# Patient Record
Sex: Male | Born: 1965 | Race: Black or African American | Hispanic: No | Smoking: Current some day smoker
Health system: Southern US, Community
[De-identification: ages and names within clinical notes are randomized; demographics above are authoritative.]

## PROBLEM LIST (undated history)

## (undated) DIAGNOSIS — G479 Sleep disorder, unspecified: Secondary | ICD-10-CM

## (undated) DIAGNOSIS — J471 Bronchiectasis with (acute) exacerbation: Secondary | ICD-10-CM

## (undated) DIAGNOSIS — B20 Human immunodeficiency virus [HIV] disease: Secondary | ICD-10-CM

## (undated) DIAGNOSIS — F419 Anxiety disorder, unspecified: Secondary | ICD-10-CM

## (undated) DIAGNOSIS — G4733 Obstructive sleep apnea (adult) (pediatric): Secondary | ICD-10-CM

## (undated) DIAGNOSIS — R339 Retention of urine, unspecified: Secondary | ICD-10-CM

## (undated) DIAGNOSIS — N401 Enlarged prostate with lower urinary tract symptoms: Secondary | ICD-10-CM

## (undated) DIAGNOSIS — Z21 Asymptomatic human immunodeficiency virus [HIV] infection status: Secondary | ICD-10-CM

## (undated) DIAGNOSIS — F329 Major depressive disorder, single episode, unspecified: Secondary | ICD-10-CM

## (undated) HISTORY — PX: COLONOSCOPY: SHX174

---

## 2004-11-29 ENCOUNTER — Emergency Department: Payer: Self-pay | Admitting: Emergency Medicine

## 2005-11-01 ENCOUNTER — Emergency Department: Payer: Self-pay | Admitting: Emergency Medicine

## 2007-03-17 ENCOUNTER — Emergency Department: Payer: Self-pay | Admitting: Internal Medicine

## 2009-09-28 ENCOUNTER — Emergency Department: Payer: Self-pay | Admitting: Emergency Medicine

## 2011-12-30 ENCOUNTER — Emergency Department: Payer: Self-pay | Admitting: Unknown Physician Specialty

## 2011-12-30 LAB — CBC
HCT: 37 % — ABNORMAL LOW (ref 40.0–52.0)
MCH: 23.1 pg — ABNORMAL LOW (ref 26.0–34.0)
Platelet: 137 10*3/uL — ABNORMAL LOW (ref 150–440)
RBC: 5.13 10*6/uL (ref 4.40–5.90)
WBC: 4.1 10*3/uL (ref 3.8–10.6)

## 2011-12-30 LAB — URINALYSIS, COMPLETE
Bacteria: NONE SEEN
Leukocyte Esterase: NEGATIVE
Nitrite: NEGATIVE
Protein: NEGATIVE
RBC,UR: NONE SEEN /HPF (ref 0–5)
Specific Gravity: 1.014 (ref 1.003–1.030)
WBC UR: 1 /HPF (ref 0–5)

## 2011-12-30 LAB — DRUG SCREEN, URINE
Amphetamines, Ur Screen: NEGATIVE (ref ?–1000)
Benzodiazepine, Ur Scrn: NEGATIVE (ref ?–200)
Cannabinoid 50 Ng, Ur ~~LOC~~: NEGATIVE (ref ?–50)
Cocaine Metabolite,Ur ~~LOC~~: NEGATIVE (ref ?–300)
MDMA (Ecstasy)Ur Screen: NEGATIVE (ref ?–500)
Phencyclidine (PCP) Ur S: NEGATIVE (ref ?–25)
Tricyclic, Ur Screen: NEGATIVE (ref ?–1000)

## 2011-12-30 LAB — COMPREHENSIVE METABOLIC PANEL
Albumin: 3.9 g/dL (ref 3.4–5.0)
Alkaline Phosphatase: 41 U/L — ABNORMAL LOW (ref 50–136)
BUN: 13 mg/dL (ref 7–18)
Bilirubin,Total: 1 mg/dL (ref 0.2–1.0)
Calcium, Total: 7.9 mg/dL — ABNORMAL LOW (ref 8.5–10.1)
Co2: 22 mmol/L (ref 21–32)
EGFR (African American): >60
EGFR (Non-African Amer.): >60
Glucose: 101 mg/dL — ABNORMAL HIGH (ref 65–99)
Osmolality: 283 (ref 275–301)
SGOT(AST): 23 U/L (ref 15–37)

## 2011-12-30 LAB — CK TOTAL AND CKMB (NOT AT ARMC)
CK, Total: 208 U/L (ref 35–232)
CK-MB: 1.3 ng/mL (ref 0.5–3.6)

## 2011-12-30 LAB — ACETAMINOPHEN LEVEL: Acetaminophen: <2 ug/mL

## 2011-12-30 LAB — APTT: Activated PTT: 28.7 secs (ref 23.6–35.9)

## 2011-12-30 LAB — MAGNESIUM: Magnesium: 1.8 mg/dL

## 2011-12-30 LAB — ETHANOL
Ethanol %: 0.207 % — ABNORMAL HIGH (ref 0.000–0.080)
Ethanol: 207 mg/dL

## 2011-12-30 LAB — PROTIME-INR: Prothrombin Time: 15.4 secs — ABNORMAL HIGH (ref 11.5–14.7)

## 2012-07-27 ENCOUNTER — Inpatient Hospital Stay: Payer: Self-pay | Admitting: Psychiatry

## 2012-07-27 LAB — CBC WITH DIFFERENTIAL/PLATELET
Basophil %: 0.9 %
Eosinophil %: 1 %
HCT: 45.6 % (ref 40.0–52.0)
HGB: 14.6 g/dL (ref 13.0–18.0)
Lymphocyte #: 2.4 10*3/uL (ref 1.0–3.6)
MCH: 24 pg — ABNORMAL LOW (ref 26.0–34.0)
MCHC: 32 g/dL (ref 32.0–36.0)
MCV: 75 fL — ABNORMAL LOW (ref 80–100)
Monocyte #: 0.9 x10 3/mm (ref 0.2–1.0)
Monocyte %: 10.9 %
Neutrophil #: 4.5 10*3/uL (ref 1.4–6.5)
Neutrophil %: 56.8 %
Platelet: 169 10*3/uL (ref 150–440)
RBC: 6.09 10*6/uL — ABNORMAL HIGH (ref 4.40–5.90)
RDW: 17 % — ABNORMAL HIGH (ref 11.5–14.5)

## 2012-07-27 LAB — URINALYSIS, COMPLETE
Bacteria: NONE SEEN
Glucose,UR: NEGATIVE mg/dL (ref 0–75)
Ketone: NEGATIVE
Ph: 6 (ref 4.5–8.0)
Protein: NEGATIVE
RBC,UR: 1 /HPF (ref 0–5)
WBC UR: 2 /HPF (ref 0–5)

## 2012-07-27 LAB — BASIC METABOLIC PANEL
Calcium, Total: 8.7 mg/dL (ref 8.5–10.1)
Co2: 26 mmol/L (ref 21–32)
Creatinine: 1.12 mg/dL (ref 0.60–1.30)
EGFR (African American): >60
EGFR (Non-African Amer.): >60
Glucose: 128 mg/dL — ABNORMAL HIGH (ref 65–99)
Osmolality: 284 (ref 275–301)
Potassium: 3.9 mmol/L (ref 3.5–5.1)
Sodium: 141 mmol/L (ref 136–145)

## 2012-07-27 LAB — DRUG SCREEN, URINE
Cannabinoid 50 Ng, Ur ~~LOC~~: NEGATIVE (ref ?–50)
Cocaine Metabolite,Ur ~~LOC~~: POSITIVE (ref ?–300)
MDMA (Ecstasy)Ur Screen: NEGATIVE (ref ?–500)
Tricyclic, Ur Screen: NEGATIVE (ref ?–1000)

## 2012-07-27 LAB — ETHANOL: Ethanol: <3 mg/dL

## 2012-08-02 LAB — CULTURE, BLOOD (SINGLE)

## 2012-09-04 ENCOUNTER — Emergency Department: Payer: Self-pay | Admitting: *Deleted

## 2012-11-01 ENCOUNTER — Inpatient Hospital Stay: Payer: Self-pay | Admitting: Psychiatry

## 2012-11-01 LAB — URINALYSIS, COMPLETE
Bilirubin,UR: NEGATIVE
Ketone: NEGATIVE
Leukocyte Esterase: NEGATIVE
Ph: 5 (ref 4.5–8.0)
Protein: NEGATIVE
Specific Gravity: 1.033 (ref 1.003–1.030)
Squamous Epithelial: <1
WBC UR: 1 /HPF (ref 0–5)

## 2012-11-01 LAB — COMPREHENSIVE METABOLIC PANEL
Anion Gap: 6 — ABNORMAL LOW (ref 7–16)
BUN: 14 mg/dL (ref 7–18)
Bilirubin,Total: 0.8 mg/dL (ref 0.2–1.0)
Calcium, Total: 8.5 mg/dL (ref 8.5–10.1)
EGFR (African American): >60
EGFR (Non-African Amer.): >60
Glucose: 118 mg/dL — ABNORMAL HIGH (ref 65–99)
Osmolality: 285 (ref 275–301)
SGOT(AST): 33 U/L (ref 15–37)
SGPT (ALT): 29 U/L (ref 12–78)
Total Protein: 8 g/dL (ref 6.4–8.2)

## 2012-11-01 LAB — CBC
HGB: 13.4 g/dL (ref 13.0–18.0)
MCH: 23.7 pg — ABNORMAL LOW (ref 26.0–34.0)
MCHC: 32.9 g/dL (ref 32.0–36.0)
RDW: 14.5 % (ref 11.5–14.5)
WBC: 5.6 10*3/uL (ref 3.8–10.6)

## 2012-11-01 LAB — DRUG SCREEN, URINE
Amphetamines, Ur Screen: NEGATIVE (ref ?–1000)
Barbiturates, Ur Screen: NEGATIVE (ref ?–200)
Cocaine Metabolite,Ur ~~LOC~~: POSITIVE (ref ?–300)
MDMA (Ecstasy)Ur Screen: NEGATIVE (ref ?–500)
Tricyclic, Ur Screen: NEGATIVE (ref ?–1000)

## 2012-11-01 LAB — ETHANOL: Ethanol %: <0.003 % (ref 0.000–0.080)

## 2012-11-01 LAB — SALICYLATE LEVEL: Salicylates, Serum: <1.7 mg/dL

## 2012-11-01 LAB — ACETAMINOPHEN LEVEL: Acetaminophen: <2 ug/mL

## 2012-11-02 LAB — URINALYSIS, COMPLETE
Bacteria: NONE SEEN
Bilirubin,UR: NEGATIVE
Glucose,UR: NEGATIVE mg/dL (ref 0–75)
Nitrite: NEGATIVE
Ph: 5 (ref 4.5–8.0)
Specific Gravity: 1.029 (ref 1.003–1.030)
Squamous Epithelial: 1
WBC UR: 3 /HPF (ref 0–5)

## 2012-11-02 LAB — BEHAVIORAL MEDICINE 1 PANEL
Alkaline Phosphatase: 54 U/L (ref 50–136)
Anion Gap: 6 — ABNORMAL LOW (ref 7–16)
Basophil #: 0 10*3/uL (ref 0.0–0.1)
Calcium, Total: 8.3 mg/dL — ABNORMAL LOW (ref 8.5–10.1)
Co2: 26 mmol/L (ref 21–32)
EGFR (African American): >60
EGFR (Non-African Amer.): >60
Eosinophil #: 0.1 10*3/uL (ref 0.0–0.7)
Glucose: 97 mg/dL (ref 65–99)
HCT: 37.7 % — ABNORMAL LOW (ref 40.0–52.0)
HGB: 12.3 g/dL — ABNORMAL LOW (ref 13.0–18.0)
Lymphocyte #: 2.6 10*3/uL (ref 1.0–3.6)
MCHC: 32.7 g/dL (ref 32.0–36.0)
MCV: 71 fL — ABNORMAL LOW (ref 80–100)
Monocyte #: 0.5 x10 3/mm (ref 0.2–1.0)
Monocyte %: 11.6 %
Neutrophil #: 1.2 10*3/uL — ABNORMAL LOW (ref 1.4–6.5)
Osmolality: 284 (ref 275–301)
Platelet: 114 10*3/uL — ABNORMAL LOW (ref 150–440)
RBC: 5.3 10*6/uL (ref 4.40–5.90)
RDW: 14.6 % — ABNORMAL HIGH (ref 11.5–14.5)
SGOT(AST): 27 U/L (ref 15–37)
SGPT (ALT): 24 U/L (ref 12–78)
Sodium: 142 mmol/L (ref 136–145)
Thyroid Stimulating Horm: 0.875 u[IU]/mL
WBC: 4.4 10*3/uL (ref 3.8–10.6)

## 2013-01-08 ENCOUNTER — Emergency Department: Payer: Self-pay | Admitting: Emergency Medicine

## 2013-01-08 LAB — CBC
HCT: 43.1 % (ref 40.0–52.0)
MCV: 72 fL — ABNORMAL LOW (ref 80–100)
RBC: 5.97 10*6/uL — ABNORMAL HIGH (ref 4.40–5.90)
RDW: 15.8 % — ABNORMAL HIGH (ref 11.5–14.5)

## 2013-01-08 LAB — BASIC METABOLIC PANEL
Anion Gap: 7 (ref 7–16)
Chloride: 105 mmol/L (ref 98–107)
Co2: 27 mmol/L (ref 21–32)
Creatinine: 1.36 mg/dL — ABNORMAL HIGH (ref 0.60–1.30)
EGFR (African American): >60
EGFR (Non-African Amer.): >60
Osmolality: 278 (ref 275–301)
Potassium: 4.1 mmol/L (ref 3.5–5.1)
Sodium: 139 mmol/L (ref 136–145)

## 2013-01-09 LAB — URINALYSIS, COMPLETE
Bacteria: NONE SEEN
Blood: NEGATIVE
Glucose,UR: NEGATIVE mg/dL (ref 0–75)
Ketone: NEGATIVE
Leukocyte Esterase: NEGATIVE
Nitrite: NEGATIVE
Ph: 5 (ref 4.5–8.0)
Protein: 30

## 2013-01-09 LAB — DRUG SCREEN, URINE
Barbiturates, Ur Screen: NEGATIVE (ref ?–200)
Benzodiazepine, Ur Scrn: NEGATIVE (ref ?–200)
MDMA (Ecstasy)Ur Screen: NEGATIVE (ref ?–500)
Opiate, Ur Screen: NEGATIVE (ref ?–300)
Phencyclidine (PCP) Ur S: NEGATIVE (ref ?–25)
Tricyclic, Ur Screen: NEGATIVE (ref ?–1000)

## 2013-03-23 ENCOUNTER — Emergency Department: Payer: Self-pay | Admitting: Emergency Medicine

## 2013-05-18 IMAGING — CT CT HEAD WITHOUT CONTRAST
1 series · 16 of 30 positions shown, 20 images · non-contrast
Comparison: none

REASON FOR EXAM: ams
COMMENTS:

[Series 9: without · axial · non-contrast · 0.48mm/px · z∈[+109,+249]mm · 16 of 32 slices shown, 20 images]
[im 2/32  brain]
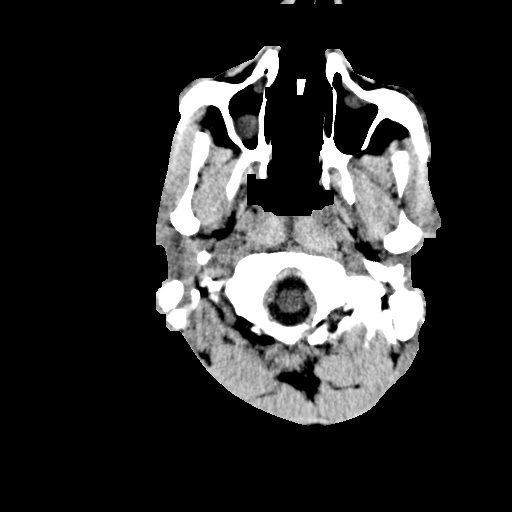
[im 2/32  bone]
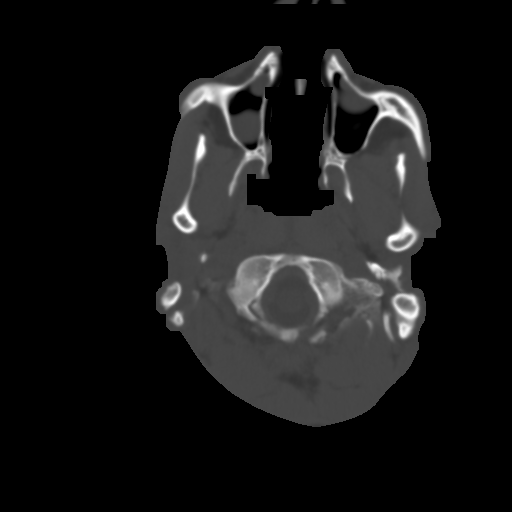
[im 4/32  brain]
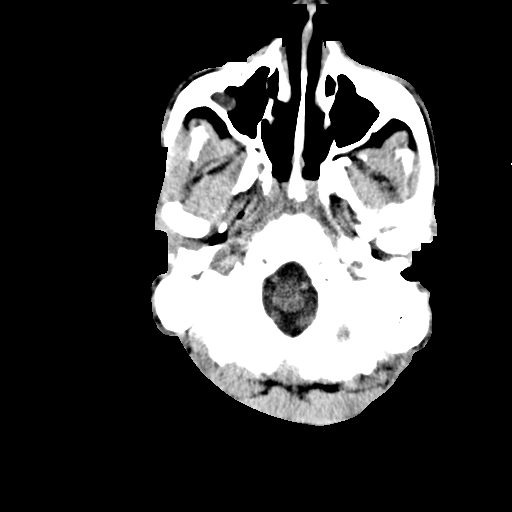
[im 6/32  brain]
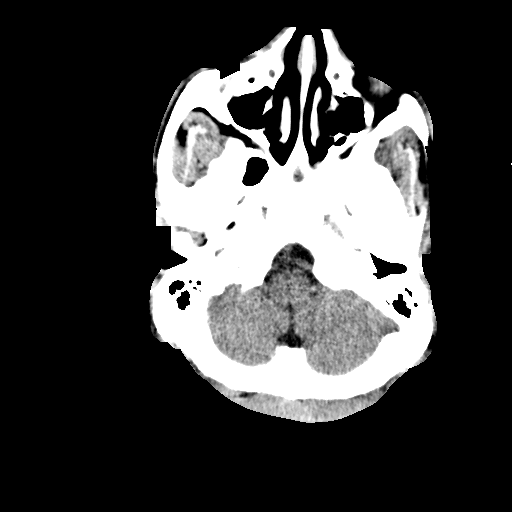
[im 8/32  brain]
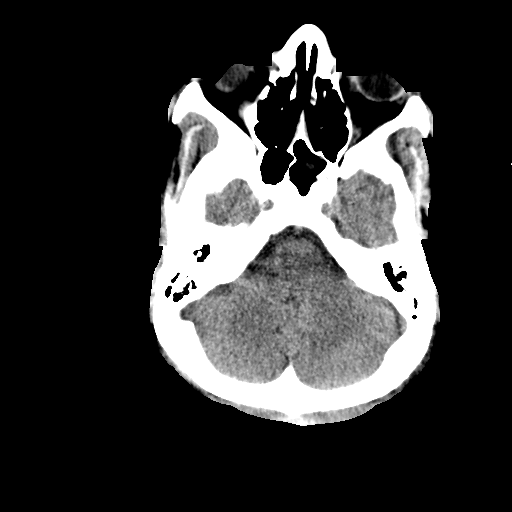
[im 9/32  brain]
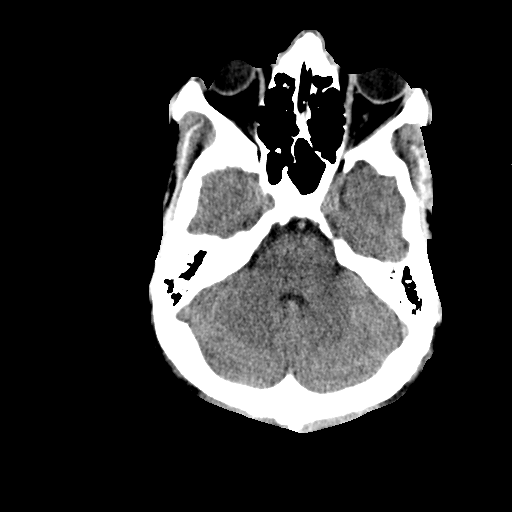
[im 9/32  bone]
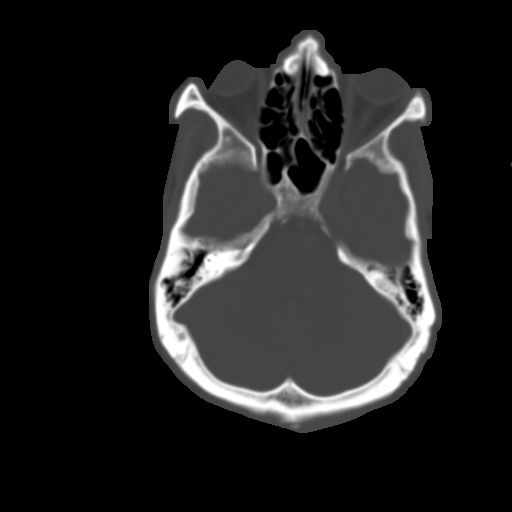
[im 11/32  brain]
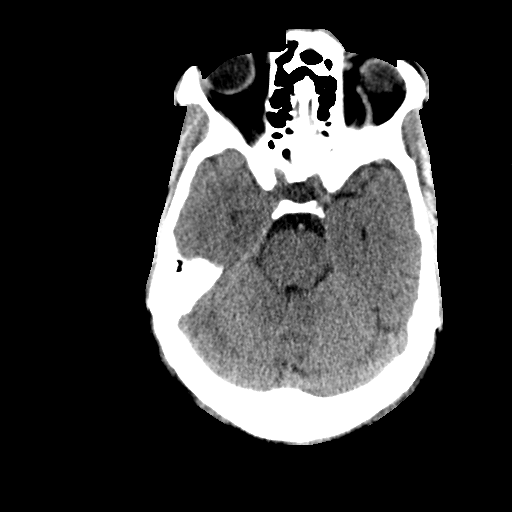
[im 13/32  brain]
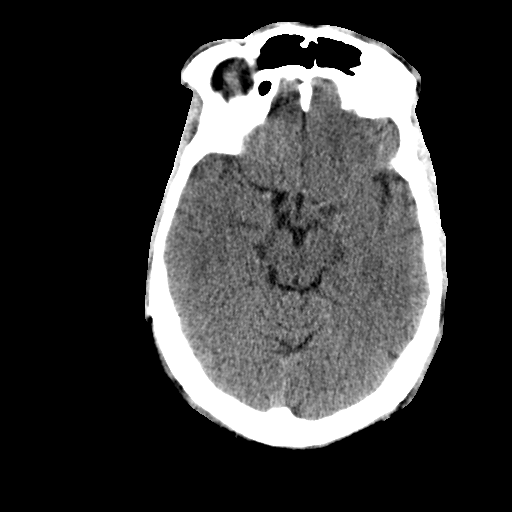
[im 15/32  brain]
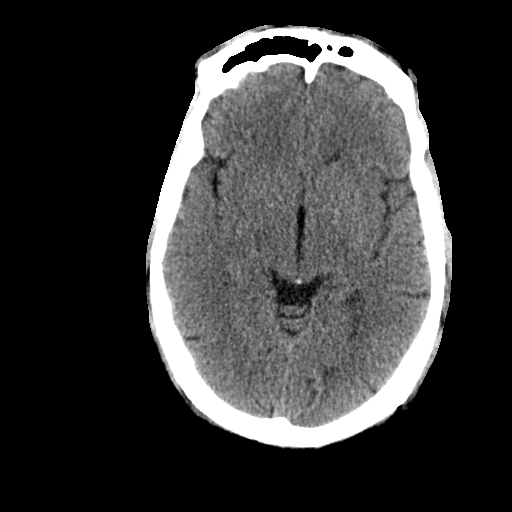
[im 17/32  brain]
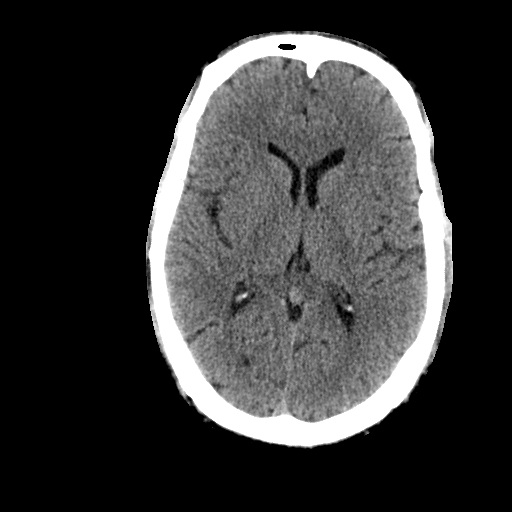
[im 17/32  bone]
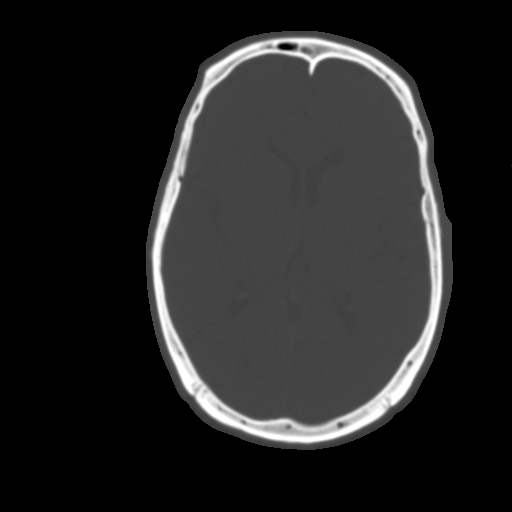
[im 19/32  brain]
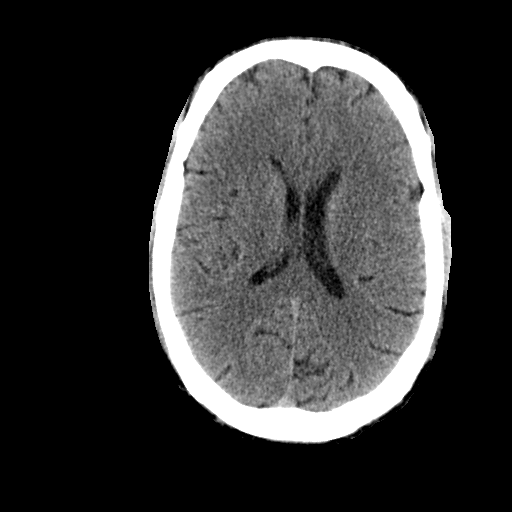
[im 21/32  brain]
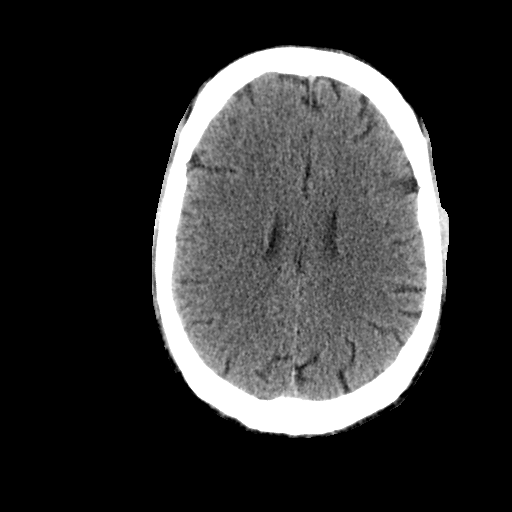
[im 23/32  brain]
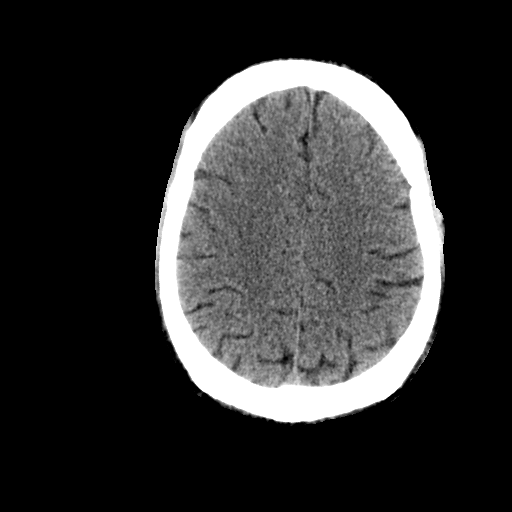
[im 24/32  brain]
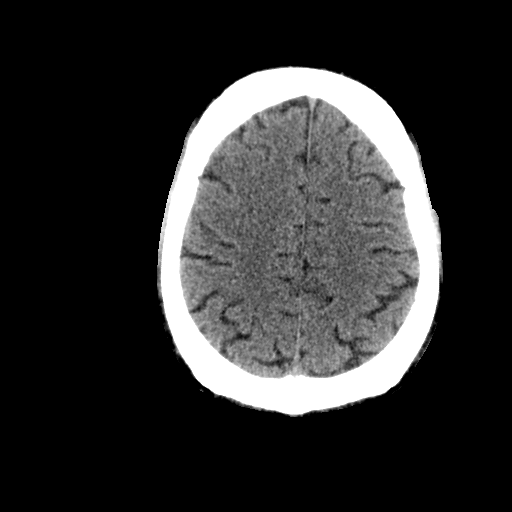
[im 24/32  bone]
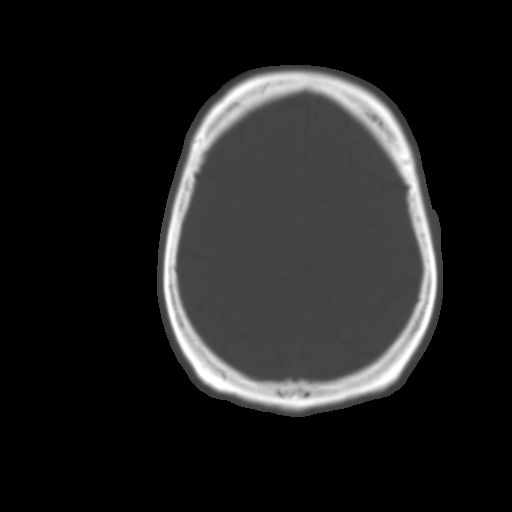
[im 26/32  brain]
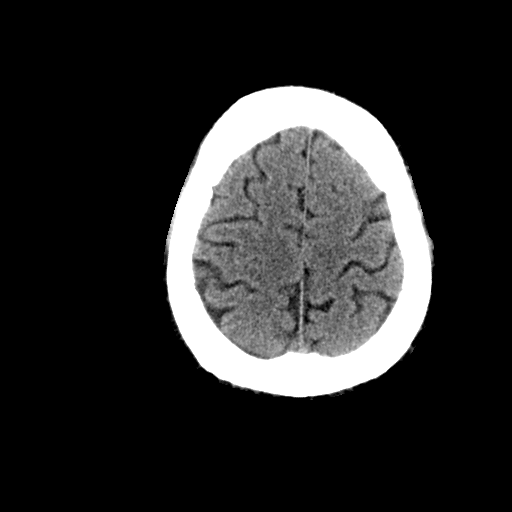
[im 28/32  brain]
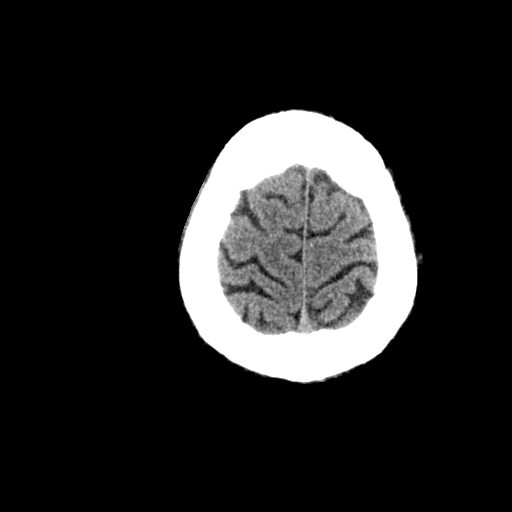
[im 30/32  brain]
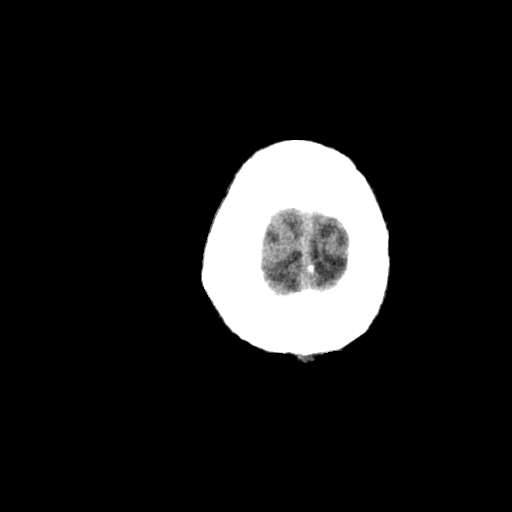

[16 of 30 positions shown; findings below may reference images not displayed]

PROCEDURE:     CT  - CT HEAD WITHOUT CONTRAST  - December 30, 2011  [DATE]

RESULT:     Noncontrast CT scanning was performed through the brain with
reconstructions in the axial plane at 5 mm intervals and slice thicknesses.

The ventricles are normal in size and position. There is no intracranial
hemorrhage nor intracranial mass effect. There is no evidence of an evolving
ischemic infarction. I see no abnormal intracranial calcifications. The
cerebellum and brainstem are normal in density.

At bone window settings there is soft tissue density material within the
maxillary sinuses which suggests retention cysts or polyps. A similar
finding is seen in a left sphenoid sinus cell. The ethmoid and frontal
sinuses are clear. There is no evidence of an acute skull fracture.
IMPRESSION: 1. I see no acute abnormality of the brain.
2. There are likely retention cysts or polyps in the maxillary and left
sphenoid sinus cells.

## 2013-07-05 LAB — CBC
HCT: 42.7 % (ref 40.0–52.0)
HGB: 14 g/dL (ref 13.0–18.0)
MCV: 72 fL — ABNORMAL LOW (ref 80–100)
RBC: 5.95 10*6/uL — ABNORMAL HIGH (ref 4.40–5.90)
RDW: 15.6 % — ABNORMAL HIGH (ref 11.5–14.5)
WBC: 5.4 10*3/uL (ref 3.8–10.6)

## 2013-07-05 LAB — DRUG SCREEN, URINE
Amphetamines, Ur Screen: NEGATIVE (ref ?–1000)
Cannabinoid 50 Ng, Ur ~~LOC~~: NEGATIVE (ref ?–50)
Cocaine Metabolite,Ur ~~LOC~~: POSITIVE (ref ?–300)
Opiate, Ur Screen: NEGATIVE (ref ?–300)
Phencyclidine (PCP) Ur S: NEGATIVE (ref ?–25)
Tricyclic, Ur Screen: NEGATIVE (ref ?–1000)

## 2013-07-05 LAB — COMPREHENSIVE METABOLIC PANEL
Albumin: 3.6 g/dL (ref 3.4–5.0)
Alkaline Phosphatase: 59 U/L (ref 50–136)
Anion Gap: 7 (ref 7–16)
Calcium, Total: 8.7 mg/dL (ref 8.5–10.1)
Chloride: 110 mmol/L — ABNORMAL HIGH (ref 98–107)
Creatinine: 1.1 mg/dL (ref 0.60–1.30)
Potassium: 4 mmol/L (ref 3.5–5.1)
SGOT(AST): 39 U/L — ABNORMAL HIGH (ref 15–37)
SGPT (ALT): 32 U/L (ref 12–78)
Sodium: 142 mmol/L (ref 136–145)
Total Protein: 8.1 g/dL (ref 6.4–8.2)

## 2013-07-05 LAB — URINALYSIS, COMPLETE
Bilirubin,UR: NEGATIVE
Blood: NEGATIVE
Nitrite: NEGATIVE
Protein: NEGATIVE
RBC,UR: 1 /HPF (ref 0–5)
WBC UR: 3 /HPF (ref 0–5)

## 2013-07-05 LAB — ETHANOL
Ethanol %: 0.024 % (ref 0.000–0.080)
Ethanol: 24 mg/dL

## 2013-07-07 ENCOUNTER — Inpatient Hospital Stay: Payer: Self-pay | Admitting: Psychiatry

## 2013-07-08 LAB — TSH: Thyroid Stimulating Horm: 0.655 u[IU]/mL

## 2013-12-14 IMAGING — CR RIGHT FOOT COMPLETE - 3+ VIEW
1 series · 3 of 3 positions shown · non-contrast
Comparison: none

REASON FOR EXAM: nail puncture wound yesterday, now with new pain and
swelling
COMMENTS:

PROCEDURE:     DXR - DXR FOOT RT COMPLETE W/OBLIQUES  - July 27, 2012  [DATE]
RESULT:     Three views of the right foot reveal the bones to be adequately
mineralized. There is no evidence of an acute fracture or dislocation. The
overlying soft tissues exhibit no foreign bodies or gas collections.

[Series 1: x foot ap right · 0.14mm/px · 3 of 3 slices shown]
[im 1/3]
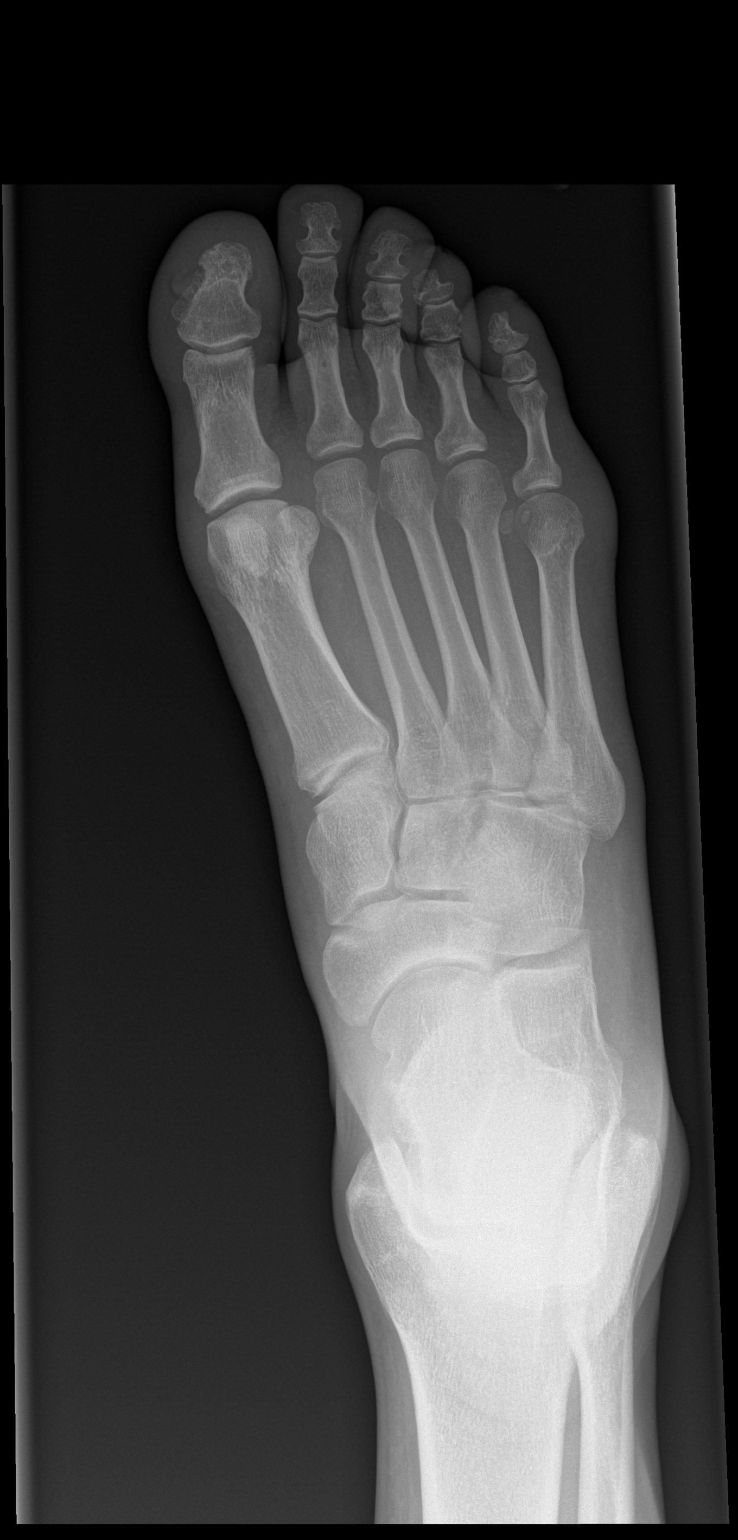
[im 2/3]
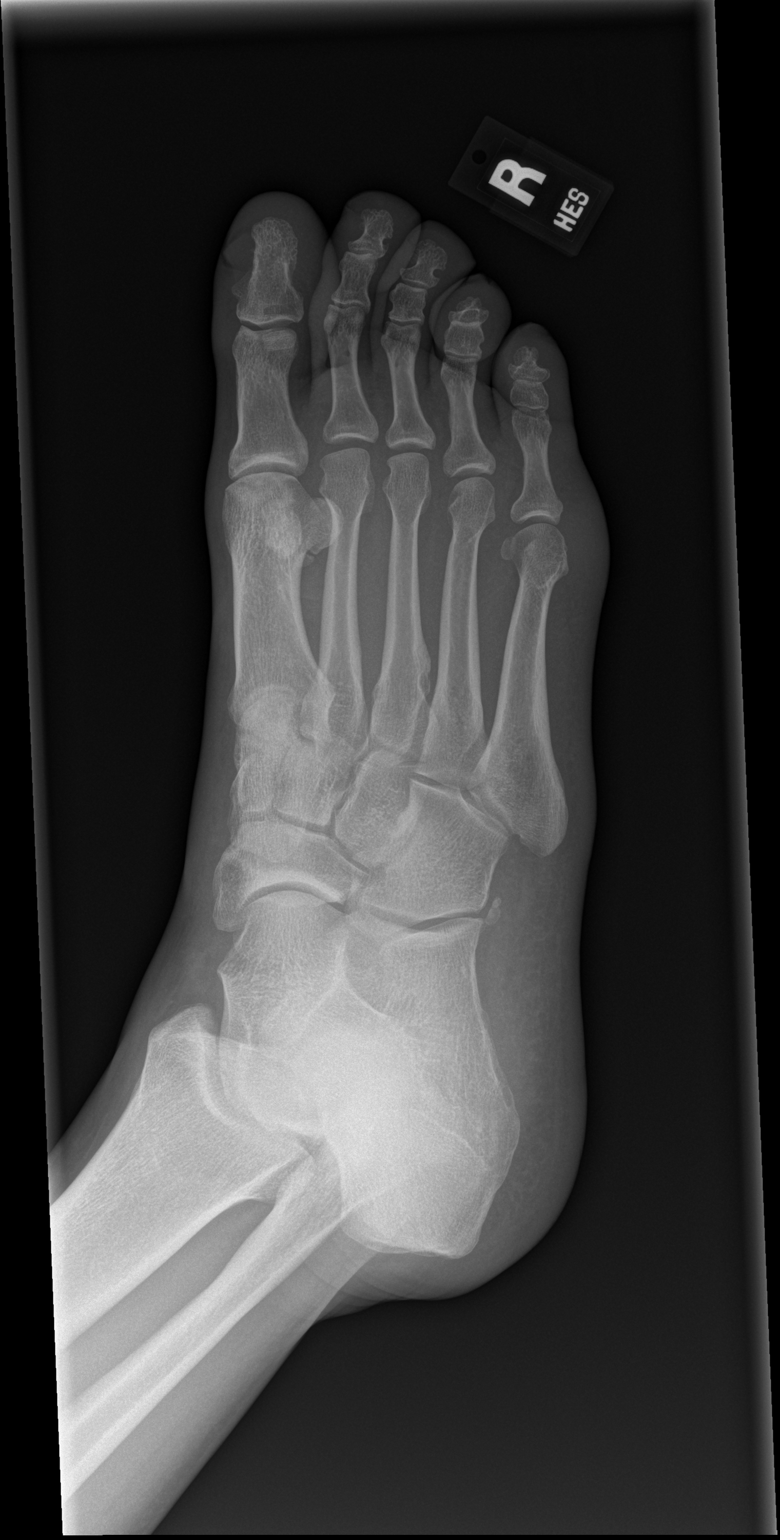
[im 3/3]
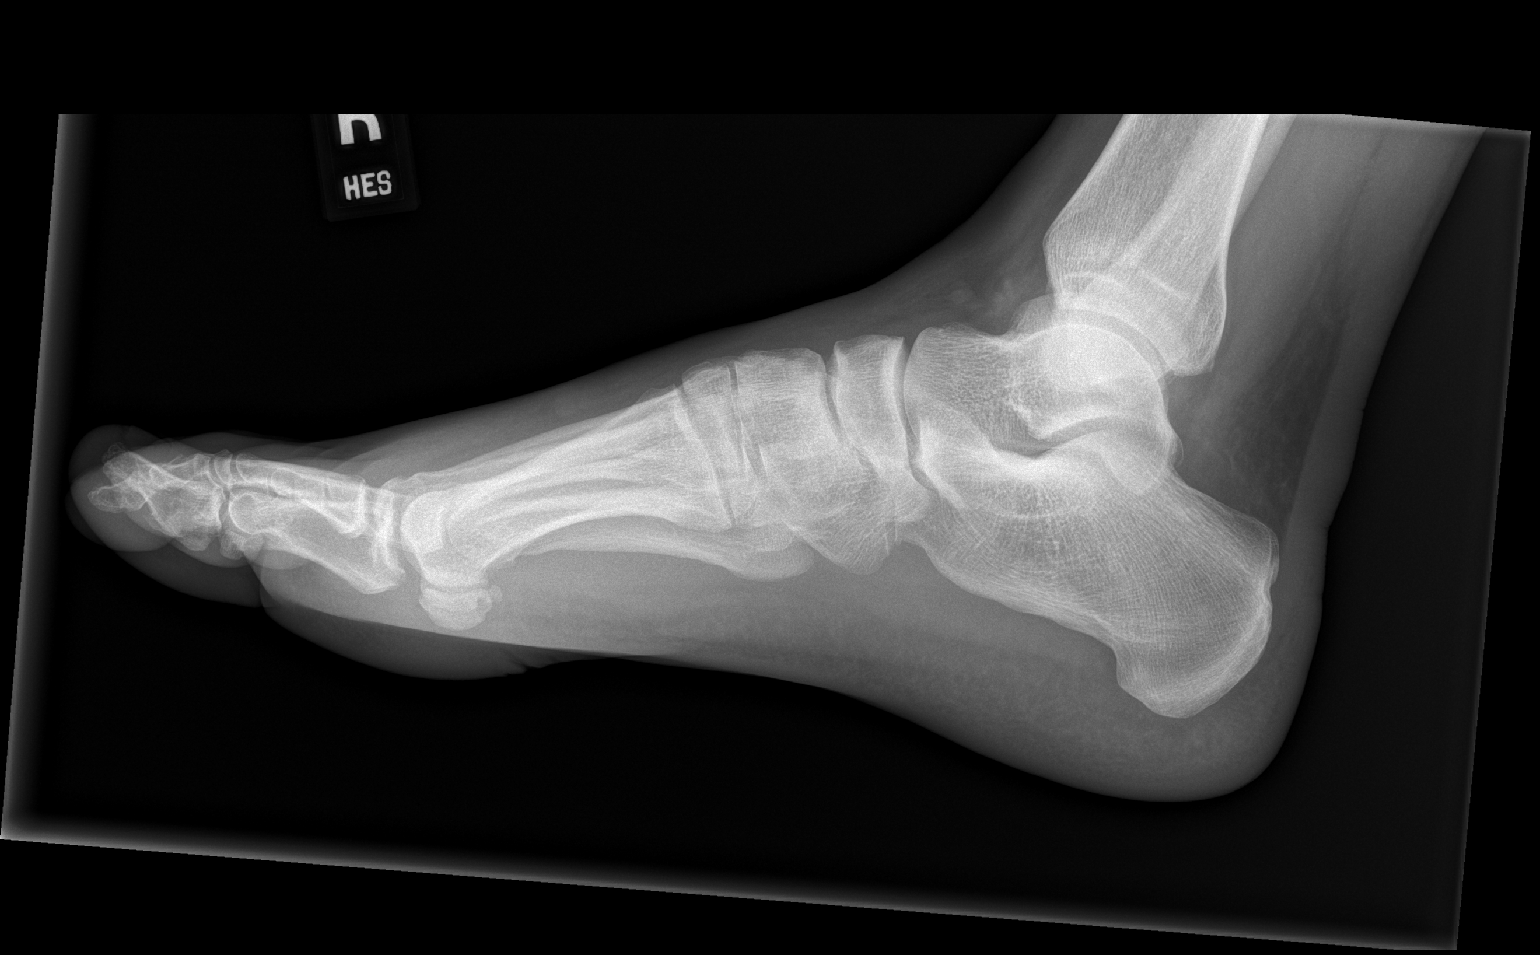

[3 of 3 positions shown; findings below may reference images not displayed]

IMPRESSION: There is no acute bony abnormality the right foot. No soft
tissue abnormality is demonstrated.

[REDACTED]

## 2015-03-09 NOTE — Discharge Summary (Signed)
PATIENT NAME:  Alan, Arnold MR#:  [id number] DATE OF BIRTH:  07-07-1966  DATE OF ADMISSION:  07/27/2012 DATE OF DISCHARGE:  08/02/2012  HOSPITAL COURSE: Patient is a 49 year old man with a history of alcohol and drug abuse who came to the Emergency Room voluntarily requesting help for detox. He had been drinking regularly and doing crack cocaine regularly. When he was using he was getting paranoid hearing voices at times and occasionally had vague suicidal ideation. He has had major social problems and losses related to his substance abuse. He was admitted to the hospital and treated with the standard protocol for alcohol withdrawal. He tolerated this well. Had some transient elevation of his blood pressure but otherwise was asymptomatic for the most part. He participated in groups appropriately. At no time was he aggressive or behaving in a dangerous manner. He was educated about substance abuse treatment and the options for further follow up. Patient is agreeable to going to the alcohol and drug abuse treatment center in [facility] for further follow-up treatment. He is a voluntary patient here and will be discharged in the morning with a taxi voucher for direct transport to [place]. He is agreeable to the plan and understands what is entailed. Patient has not been started on any psychiatric medicine other than p.r.n. trazodone for sleep. He is not currently endorsing symptoms of major depression or suicidal ideation or psychosis. Medically the patient has been treated for a cellulitis related to having stepped on a nail with his foot. He is on clindamycin currently at 300 mg 3 times a day. That was started on the 7th in the Emergency Room. Presume another five days of treatment unless further evaluation changes the plan. Patient also has a history of being HIV positive and has dropped out of receiving any follow up or treatment. An infectious disease consultation was requested while he was in the hospital but  time was not sufficient for the consult to be done before discharge. The patient has been advised that he should seek consultation with an infectious disease doctor for further recommendations at his earliest opportunity and states agreement to this.   DISCHARGE MEDICATIONS:  1. Cleocin 300 mg q.8 hours for another five days after discharge.  2. Trazodone 50 mg at night p.r.n. for sleep.   LABORATORY, DIAGNOSTIC AND RADIOLOGICAL DATA: Admission labs showed a drug screen positive for cocaine. The chemistry panel showed a glucose elevated at 128 but it was a nonfasting level. Chloride slightly elevated at 108. Alcohol undetectable at admission. CBC showed low MCV at 75, otherwise no marketable findings. Blood culture was done because of the foot injury and is negative. Urinalysis is unremarkable. An x-ray exam was done of the right foot which showed no bony abnormality or significant problem.   MENTAL STATUS EXAM: Casually dressed, neatly groomed man who looks his stated age. Polite and cooperative. Makes good eye contact. Psychomotor activity is normal. Speech is normal in rate, tone, and volume. Affect is a bit constricted but not depressed or bizarre. Mood is stated as being okay. Thoughts are lucid with no evidence of thought disorder or loosening of associations. Denies hallucinations. Denies any suicidal or homicidal ideation. He is alert and oriented x4 with good short and long term memory, normal intelligence apparent, reasonably good and improved insight and judgment.   DISPOSITION: Discharge with direct plan for transportation to [facility] in [place].   DIAGNOSIS PRINCIPLE AND PRIMARY:  AXIS I: Alcohol dependence.   SECONDARY DIAGNOSES:  AXIS I:  1. Cocaine dependence.  2. Substance-induced mood disorder, depressed.   AXIS II: Deferred.   AXIS III: HIV positive by history, acute cellulitis from puncture injury to the right foot.   AXIS IV: Severe from multiple losses from substance abuse.    AXIS V: Functioning at time of discharge 39.  ____________________________ Audery AmelJohn T. Ryllie Nieland, MD jtc:cms D: 08/01/2012 16:44:00 ET T: 08/01/2012 17:10:37 ET JOB#: [id number]  cc: Audery AmelJohn T. Javeah Loeza, MD, <Dictator> Audery AmelJOHN T Aadin Gaut MD ELECTRONICALLY SIGNED 08/01/2012 22:09

## 2015-03-09 NOTE — H&P (Signed)
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 The patient comes for readmission to [facility] after his recent discharge on 08/02/2012 where he was sent to [facility] in [place], [place]. His chief complaint, "I need help for detox, I've been drinking too much."    HISTORY OF PRESENT ILLNESS: The patient was discharged from [facility] on 08/02/2012 after detox and was given transportation for [facility] in [place]. He reports he stayed there for 7 days and wanted to go to outpatient program and so he walked out and then he did not have any transportation and currently he reports he has no place to stay and calls himself as homeless.   PAST PSYCHIATRIC HISTORY: This is his second inpatient hospitalization to psychiatry. No history of suicide attempts. First inpatient hospitalization to psychiatry was in September 2013 for a similar episode of drinking alcohol. The patient reports that he has been drinking alcohol "too much". He has been drinking six to eight 40 ounce beers per day and last drink was just prior to his admission.  He started drinking 9 days after his discharge, on 08/02/2012. No history of suicide attempts. Not being followed by any psychiatrist.   FAMILY HISTORY OF MENTAL ILLNESS: No known history of mental illness. No known history of suicides in the family.  FAMILY HISTORY: Raised by grandparents and his parents with alcoholics and drug abusers. Grandfather died of MI at age 75 years. Grandmother is living and is in her 21s. He used to live with his grandmother but not anymore.  PERSONAL HISTORY: Born in [place]. Dropped out in 11th grade. No GED.  He had a baby in 11th grade and had to take care of it.   WORK HISTORY: First job was at [employer] at age 27 years. This job lasted 2 years. Quit the job for another job. 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 Last worked several months and he was working for somebody and got paid under the table and had a regular job five years ago.   MILITARY HISTORY: None.  MARRIAGES: Married 3 times. Cause of divorce in all marriages was his drinking and drug abuse. Has 3 children and in touch with them and tries to be close, but because of his alcohol problems he cannot.   ALCOHOL AND DRUGS: First drink of alcohol was at 18 years. The patient reports it became a problem soon after, but he thinks it is not a problem and he can be helped. He has had 2 DWIs and lost his driver's license. He moves around by walking. Never arrested for public drunkenness. Currently has been drinking at the rate of six to eight 40 ounce beers per day and last drink was just prior to his admission. Does admit to using crack cocaine and smokes 30 dollars worth each time. Denies any THC, denies using IV drugs and smokes nicotine cigarettes as much as he can get for many years.   MEDICAL HISTORY: Has hypertension which is labile. No known history of diabetes mellitus. No major surgeries. No major injuries. No history of motor vehicle accident, never been unconscious. No known drug allergies. His primary  care physician is [place] and just called recently to make an appointment.   PHYSICAL EXAMINATION:  VITALS: temperature 97.4, pulse 60 per minute and regular, respirations 20 per minute and regular and blood pressure is 130/70 mmHg.  HEENT: Head is normocephalic, atraumatic. Eyes PERRLA. Fundi bilaterally benign. EOMs visualized. Tympanic membranes have no exudate.   NECK: Supple without any organomegaly, lymphadenopathy or thyromegaly.   CHEST: Normal expansion. Normal breath sounds.  HEART:  Normal S1 and S2 without any murmurs or gallops.  ABDOMEN: Soft. No organomegaly. Bowel sounds heard.   RECTAL: Deferred.  NEUROLOGIC: Gait - walks with a limp because of chronic pain secondary to injury secondary to football many years ago. Romberg is negative. Cranial nerves II through XII are grossly intact. DTRs are 2+. Plantars have normal response.  MENTAL STATUS EXAMINATION: The patient is dressed in street clothes, alert and oriented to place, person and time, calm, pleasant and cooperative, no agitation. Affect is appropriate with his mood which is low and down and depressed about his finances, living situation and being homeless. Does admit feeling worthless and useless at times but not now. Admits to feeling hopeless and useless but not now because he is being helped.  Denies any ideas or plans to hurt himself or others and contracts for safety. No psychosis. Denies auditory or visual hallucinations, denies hearing voices or seeing things, denies paranoid or suspicious ideas. Thought processes are logical, but goal direction needs to be questioned and his insight into alcohol drinking needs to be questioned. Memory is intact. He could spell the word world forward and backward without much problem. Judgment is fair and intact. He could count money. Abstract and interpretation is fair. Insight and judgment are guarded.   IMPRESSION:  AXIS I:  1. Alcohol dependence, chronic, continuous with intoxication. 2. Crack cocaine abuse, continuous. 3. Nicotine abuse. 4. Substance-induced mood disorder.  AXIS II: Deferred.  AXIS III: Chronic right foot pain secondary to status post football injury many years ago and HIV positive.  AXIS IV: Severe - occupational, financial, living situation and problems with primary support.  AXIS V: GAF 25.  PLAN: The patient is admitted to [facility] for closer observation, management and help. He will be started on alcohol detox and the rest of  the medications. During the stay in the hospital, he will be given milieu therapy, support and counseling. He will take part in individual and group therapy where substance problems will be addressed. At the time of discharge, detox will be completed and appropriate followup appointment will be made as desired by the patient.  ____________________________ Jannet MantisSurya K. Guss Bundehalla, MD skc:sb D: 11/02/2012 18:56:28 ET T: 11/03/2012 10:07:44 ET JOB#: [id number]  cc: Monika SalkSurya K. Guss Bundehalla, MD, <Dictator> Beau FannySURYA K Madden Garron MD ELECTRONICALLY SIGNED 11/03/2012 21:03

## 2015-03-09 NOTE — H&P (Signed)
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  The patient has been divorced three times and lives with his grandmother who is 58 years old.  The patient and grandmother live in a house.  The patient comes for his first inpatient hospitalization at [facility] with the chief complaint, "I need to get help for my drug abuse.  I am hearing voices.  These voices constantly talk to me and I cannot control them."  HISTORY OF PRESENT ILLNESS:  The patient reports that he has been smoking crack cocaine at the rate of one gram a day for the past 19 years.  He last smoked it just prior to admission to the hospital.  In addition, he reports that he has been drinking alcohol at the rate of six 40-ounce beers a day for the past three or four years.  He stays drunk most of the time.  He has been hearing voices for many years and these are bothering him and he decided to get help.  He called his cousin who brought him here for help.  PAST PSYCHIATRIC HISTORY:  No previous history of inpatient hospitalization on psychiatry, no history of suicide attempts.  Not being followed by any psychiatrist at this time.  FAMILY HISTORY OF MENTAL ILLNESS:  No known mental illness. No known history of suicides in the family.  FAMILY HISTORY:  Raised by grandparents and the parents were alcoholics, drunks, and drug abusers.  Grandfather died of myocardial infarction at the age of 4.  Grandmother is living, 4 years old and currently the patient lives with the grandmother.   PERSONAL HISTORY:  Born in [place], dropped out in the 11th grade because he had a baby.  No GED.  WORK HISTORY:   First job was at [employer] at the age 57 or 15 years.  This job lasted two years.  Quit for other job. 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  MILITARY HISTORY:  None.  MARRIAGES:  Married three times.  Cause of divorce in all marriages was his drinking and drug abuse.  He has three children and is in touch with them and trying to close but currently is not because of his drinking and staying drunk.  ALCOHOL AND DRUGS:  First drink of alcohol at 18 years.  When the patient was asked when it became a problem he reported that he never felt he had a problem with alcohol.  Two DWIs, lost his driver's license.  Never arrested for public drunkenness.  Drinking at the rate of six 40-ounce beers per day.  He does admit abusing crack cocaine by smoking for the past 19 years at the rate of one gram per day.  Denies THC.  Denies IV drugs.  Smokes 6-7 nicotine cigarettes per day.  MEDICAL HISTORY:  Has hypertension, which is labile.  No known history of diabetes mellitus.  No major surgeries.  No major injuries.  Has cellulitis and swelling and redness of the right foot with pain.  No history of motor  vehicle accidents.  Never been unconscious.  ALLERGIES:  No known drug allergies.  PRIMARY CARE PHYSICIAN:  Dr. Myrtie Hawk at [facility].  Last appointment several months ago.  Next appointment is to be made.  PHYSICAL EXAMINATION: VITAL SIGNS:  Temperature 97.8, pulse 72 per minute and regular.  Respirations 20 per minute and regular.  Blood pressure 130/90 mmHg.   HEENT:  Head is normocephalic, atraumatic.  Eyes- pupils equal, round, reactive to light and accommodation.  Fundi bilaterally benign.  EOMs visualized.  Tympanic membranes no exudate.  NECK:  Supple without any organomegaly, lymphadenopathy, or thyromegaly.   CHEST:  Normal expansion. Normal breath sounds.  HEART:   Normal S1, S2 without any murmurs or gallops.  ABDOMEN:  Soft.  No organomegaly.  Bowel sounds heard.  RECTAL:  Deferred.  NEUROLOGIC:  The patient walks with a limp because of pain in his right foot and swelling and redness of the same.  Romberg is negative.  Cranial nerves II-XII  grossly intact.  DTRs 2+ and normal. Plantars are normal response.  MENTAL STATUS EXAMINATION:  The patient is dressed in hospital clothes.  He was rather drowsy but knew where he was, though he said it was October, then said August, then finally he said September and did not know the exact date but he knew it was 2013.  He knew the capital of [place] and the capital of the [place] and name of the current president.  He does admit feeling depressed.  Affect is sad and mood depressed, admits feeling low and down, admits feeling hopeless and helpless.  Denies feeling worthless or useless.  Denies any ideas to hurt himself or others and wants to get help.  Does admit to auditory hallucinations, hearing voices, cannot identify these voices.  Sometimes these voices tell him to kill himself and he is not able to control the same.  He admits that he sees visual hallucinations and he sees bloody stuff in front of him, which he is not able to control.  Memory is intact.  He could spell the word "world" forward and backward without any problems.  Judgment is intact.  For a fire, he said he would leave.  For an envelope he said he would mail it.  He could count money.  Abstract interpretation is fair and adequate.  Insight and judgment guarded.  IMPRESSION: AXIS I.  Alcohol dependence, chronic, continuous, with intoxication.  Crack cocaine dependence, chronic continuous.  Nicotine abuse.  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  AXIS II.  Deferred.  AXIS III.  Right foot pain and redness.  Rule out cellulitis.  AXIS IV.  Severe.  Long history of alcohol dependence and crack  cocaine dependence.  Three divorces because of the same and is not able to function, not able to open up his business. Occupational and financial and social isolation.  AXIS V.  GAF 25.    PLAN:  The patient is admitted to [facility] for close observation and management and help.  He will be started on CIWA protocol and p.r.n. medications will be given.  During the stay in the hospital he will be given milieu therapy and supportive counseling.  As soon as he is ready he will take part in substance abuse counseling.  Hospitalist request will be made for his right foot pain and cellulitis and treatment of the same.  At the time of discharge, the patient will be stabilized and appropriate follow-up  appointments will be made in the community.     ____________________________ Jannet MantisSurya K. Guss Bundehalla, MD skc:bjt D: 07/28/2012 17:43:36 ET T: 07/29/2012 08:36:39 ET JOB#: [id number]  cc: Monika SalkSurya K. Guss Bundehalla, MD, <Dictator> Beau FannySURYA K Cleotilde Spadaccini MD ELECTRONICALLY SIGNED 08/04/2012 18:13

## 2015-03-12 NOTE — H&P (Signed)
PATIENT NAME:  Alan Arnold, Alan Arnold MR#:  [id number] DATE OF BIRTH:  05/08/1966  DATE OF ADMISSION:  07/07/2013  IDENTIFYING INFORMATION AND CHIEF COMPLAINT: This is a 49 year old man with a history of polysubstance dependence and being HIV positive, who presented to the hospital requesting detox.   CHIEF COMPLAINT: "I've been drinking too much."   HISTORY OF PRESENT ILLNESS: The patient states that recently he has been drinking more heavily. He has also been using cocaine on a regular basis. He says he does both of those about 3 or 4 times a week, but then goes on to clarify that he has been drinking up to 10, 40 ounce ounces bottles of beer a day. He feels like his mood has been worse and he complains of being very depressed. Feels sad a lot of the time. Energy, feels down. Hopelessness much of the time. Poor sleep and poor appetite. Says that he has lost some weight. It is not clear if he has a place to stay right now, he maybe between several different women's homes. He is not following up with outpatient treatment. He says he has had some suicidal thoughts, but has not acted on it.   PAST PSYCHIATRIC HISTORY: The patient has had several prior hospitalizations, most recently at our hospital in December of this year. In the past, he has been sent to the alcohol and drug abuse treatment center. He tells me that he very much does not want to do that now he feels that the alcohol and drug abuse treatment center was not helpful for him. He denies that he has made any suicide attempts. Denies history of psychotic symptoms. In the past diagnosis has been primarily has been substance dependence.   SOCIAL HISTORY: The patient is not currently working. He has applied for disability, but has not received it yet. He has been divorced in the past. He says that recently he was living with a girlfriend but then moved out and moved in with a different girlfriend. He complains that he used to be able to live with his  grandmother, but she threw him out of the house some time ago. He is bitter about this because he said that was his childhood home, and he feels like it was unfair to make him leave. He is not currently working and it sounds like his social support is not tremendous.   PAST MEDICAL HISTORY: The patient is HIV positive. He has been seen in the past at the [facility] at [facility] but is not following up there and is not on any medicine. In addition, he tells me that he has hepatitis, although it is not clear what kind of hepatitis he is referring to.   FAMILY HISTORY: Positive for substance abuse.   CURRENT MEDICATIONS: None.   ALLERGIES: No known drug allergies.   REVIEW OF SYSTEMS: Depressed mood, fatigue. Feels negative about himself. Feels angry in general. Having a little bit of withdrawal symptoms. Denies any acute suicidal intent, but has recently had suicidal ideation. No homicidal ideation. No psychotic symptoms.   MENTAL STATUS EXAMINATION: Casually dressed, reasonably well-groomed man who looks his stated age, cooperative with the interview. Good eye contact. Psychomotor activity slow and limited. Speech is normal in rate and tone. Affect flat. Mood stated as being depressed. Thoughts are generally lucid without loosening of associations or delusions. Denies auditory or visual hallucinations. Denies suicidal or homicidal intent. Judgment and insight recently impaired. Intelligence normal. Alert and oriented x 4.  PHYSICAL EXAMINATION: GENERAL: Healthy-appearing man who appears to be in no acute physical distress.  SKIN: No skin lesions identified.  HEENT: Pupils equal and reactive. Face symmetric. Strength and reflexes normal throughout. Gait normal. Full range of motion all extremities. Cranial nerves symmetric and normal.  LUNGS: Clear without wheezes.  HEART: Regular rate and rhythm.  ABDOMEN: Soft, nontender, normal bowel sounds.  VITAL SIGNS: Most recent vital  signs show temperature 98.5, pulse 64, respirations 18, blood pressure 141/86.   LABORATORY RESULTS: Admission labs done so far show a drug screen positive for cocaine, TSH low at 0.4. Alcohol was 24. Chemistry panel with slightly elevated chloride at 110. Hematology panel shows an elevated, red blood cell count at 5.95. Urinalysis: No sign of infection.   ASSESSMENT: A 49 year old man with alcohol and cocaine dependence and depression symptoms. Differential diagnosis includes substance-induced mood disorder as well as major depression. The patient does not have a history of delirium tremens. Does have physical problems that he is not taking care of. He has been admitted for detox and stabilization.   TREATMENT PLAN: Continue detox orders. Restart the trazodone that was used in the past for sleep. Defer any other psychiatric medicine for now. I am going to order infectious disease consult whether we need to do a further workup or treatment of his HIV status. Include him in groups and activities on the unit. He is absolutely against going to the alcohol and drug abuse treatment center so we will discuss other options for sobriety. Working already on doing some substance abuse treatment.   DIAGNOSIS, PRINCIPAL AND PRIMARY:   AXIS I: Polysubstance dependence, cocaine and alcohol prominent.   SECONDARY DIAGNOSES: AXIS I: Depression, not otherwise specified, rule out major depressive disorder versus substance-induced mood disorder.   AXIS II: Deferred.   AXIS III: HIV-positive, rule out hepatitis viral.   AXIS IV: Severe possible homelessness.   AXIS V: Functioning at time of evaluation 30.    ____________________________ Audery AmelJohn T. Clapacs, MD jtc:cc D: 07/07/2013 17:08:19 ET T: 07/07/2013 17:38:20 ET JOB#: [id number]  cc: Audery AmelJohn T. Clapacs, MD, <Dictator> Audery AmelJOHN T CLAPACS MD ELECTRONICALLY SIGNED 07/08/2013 11:13

## 2015-03-12 NOTE — Consult Note (Signed)
Chief Complaint:  Chief Complaint Requesting Detox from Alcohol   Presenting Symptoms:  Presenting Symptoms Anxiety/Panic  Substance Use   History of Present Illness:  History of Present Illness Pt is a 49 yo male with long h/o using alcohol, cocaine and is currently homeless presented to the ED. he stated that he was sick for the past couple of weeks and having sore throat, body aches, fever and unable to take care of himself. he has long h/o HIV and follows at [facility] for his HIV medciations. he stated that he has an upcoming appointment in May. reported that he was living with some friends and was consuming 2- 40 oz beers per day. Pt stated that he was also spending $30 on cocaine on a daily basis.  During my evaluation, he was sweating profusely and stated that he is feeling sick. Stated that he does not have a h/o withdrawl symtoms incl DT's and seizures. He is looking for some placement options as he might not be able to go back with his friends. He denied feelind depressed, anxious, or having any mood symptoms. No paranoia or agitation noted.  he denied SI/HI or plans at this time.   Target Symptoms:  Anxiety Somatic   Capacity Recognizes the presence of illlness  Understands consequences of treatment refusal  Exhibits acceptable reasoning/judgment  Communicates clear choices   Substance Abuse- Alcohol: The patient has a long history of alcohol dependence and despite multiple rehab efforts has not been able to remain sober for a sustained period of time..  Substance Abuse- Cocaine: The patient currently uses cocaine on a regular basis.Marland Kitchen  PAST MEDICAL & SURGICAL HX:  Significant Events:   Hepatitis C:    polysubstance abuse:    hiv:    denies surg hx:   CURRENT OUTPATIENT MEDICATIONS:  Home Medications: Medication Instructions Status  homatropine-HYDROcodone 1.5 mg-5 mg/5 mL oral syrup 5 milliliter(s) orally every 4 hours as needed for cough Active  SMZ-TMP DS  800 mg-160 mg oral tablet 1 tab(s) orally 2 times a day Active   Mental Status Exam:  Mental Status Exam Moderately built male lying in bed under the covers. he was shivering and sweating profusely at time of interview. Asked the ED nurse to check his vitals and they came back normal.   Speech Soft   Mood Anxious   Affect congruent   Thought Processes Linear/logical/goal directed   Orientation Place  Time  Person   Attention Awake   Concentration [place]   Insight Fair   Suicide Risk Assessment: Suicide Risk Level No risk inidicated.  Review of Systems:  Review of Systems:  Fever/Chills No   Cough Yes   Medications/Allergies Reviewed Medications/Allergies reviewed   NURSING FLOWSHEETS:  Vital Signs/Nurse Notes-CM: ED Vital Sign Flow Sheet:   20-Feb-14 10:27  Temp Temperature 98.3  Temp Source oral  Pulse Pulse 78  Respirations Respirations 18  SBP SBP 134  DBP DBP 80  Pulse Ox % Pulse Ox % 97  Pulse Ox Source Source Room Air  Pain Scale (0-10) Pain Scale (0-10) Scale:6  ED Behavioral Health Assessment:   20-Feb-14 08:00  Cognitive Assessment Patient Sleeping  Behavior Patient Sleeping  Nutrition and fluids offered? Sleeping  Sitter Present? Sitter Present? No  Engineer, structural Present? Yes  Conservation officer, nature? Forenisc equipment is not being utilized.  Have you  checked for scheduled medications? Yes  Potentially Harmful Objects Removed From Room Yes  Personal Belongings Secured Yes  Patient Wearing Only Hospital Provided Gown or Scrubs Yes  Plastic Bags Are Removed From Room Yes  BP Cuff, Cords, O2 and Suction Equipment Either Removed or Counted Removed  All Monitoring Cords, Cables, and Pt Care Equipment Removed or Counted Removed  Equipment and Supplies Removed From Under Stretcher Yes  All Potentially Toxic Materials Removed From Room  Yes  Nurse Call Bell or TV Controller wih Cords Removed, Shortened or Counted Removed  Sharps Container is Removed Yes  Potentially Harmful Objects Removed From Hall  Yes   LAB:  Laboratory Results: Routine Chem:    19-Feb-14 90:30, Basic Metabolic Panel (w/Total Calcium)  Glucose, Serum 118  BUN 12  Creatinine (comp) 1.36  Sodium, Serum 139  Potassium, Serum 4.1  Chloride, Serum 105  CO2, Serum 27  Calcium (Total), Serum 8.4  Anion Gap 7  Osmolality (calc) 278  eGFR (African American) >60  eGFR (Non-African American) >60  eGFR values <30m/min/1.73 m2 may be an indication of chronic  kidney disease (CKD).  Calculated eGFR is useful in patients with stable renal function.  The eGFR calculation will not be reliable in acutely ill patients  when serum creatinine is changing rapidly. It is not useful in   patients on dialysis. The eGFR calculation may not be applicable  to patients at the low and high extremes of body sizes, pregnant  women, and vegetarians.  Result Comment   potassium - Slight hemolysis, interpret results with   - caution.   Result(s) reported on 08 Jan 2013 at 06:40PM.    19-Feb-14 18:07, Ethanol, Serum  Ethanol, S. < 3  Ethanol % (comp) < 0.003  Result Comment   ETHANOL - VERIFIED IN ERROR. UNOPENED TUBE IS   - NOT AVAILABLE. LAB WILL REORDER. EMR   - NOTIFIED FOR RECOLLECT...TPL    20-Feb-14 02:48, Ethanol, Serum  Ethanol, S. < 3  Ethanol % (comp) < 0.003  Result(s) reported on 09 Jan 2013 at 06:40AM.  Urine Drugs:    20-Feb-14 02:07, Urine Drug Screen, Qual  Tricyclic Antidepressant, Ur Qual (comp) NEGATIVE  Result(s) reported on 09 Jan 2013 at 02:26AM.  Amphetamines, Urine Qual. NEGATIVE  MDMA, Urine Qual. NEGATIVE  Cocaine Metabolite, Urine Qual. POSITIVE  Opiate, Urine qual NEGATIVE  Phencyclidine, Urine Qual. NEGATIVE  Cannabinoid, Urine Qual. NEGATIVE  Barbiturates, Urine Qual. NEGATIVE  Benzodiazepine, Urine Qual. NEGATIVE   -----------------  The URINE DRUG SCREEN provides only a preliminary, unconfirmed  analytical test result and should not be used for non-medical   purposes.  Clinical consideration and professional judgment should be   applied to any positive drug screen result due to possible  interfering substances.  A more specific alternate chemical method  must be used in order to obtain a confirmed analytical result.  Gas  chromatography/mass spectrometry (GC/MS) is the preferred  confirmatory method.  Methadone, Urine Qual. NEGATIVE  Routine UA:    20-Feb-14 02:07, Urinalysis  Color (UA) Yellow  Clarity (UA) Hazy  Glucose (UA) Negative  Bilirubin (UA) Negative  Ketones (UA) Negative  Specific Gravity (UA) 1.031  Blood (UA) Negative  pH (UA) 5.0  Protein (UA) 30 mg/dL  Nitrite (UA) Negative  Leukocyte Esterase (UA) Negative  Result(s) reported on 09 Jan 2013 at 02:31AM.  RBC (UA) 1 /HPF  WBC (UA) 1 /HPF  Bacteria (UA)   NONE SEEN  Epithelial Cells (UA) <1 /HPF  Mucous (UA) PRESENT  Result(s) reported on 09 Jan 2013 at 02:31AM.  Routine Hem:    19-Feb-14 18:07, Hemogram, Platelet Count  WBC (CBC) 7.2  RBC (CBC) 5.97  Hemoglobin (CBC) 13.7  Hematocrit (CBC) 43.1  Platelet Count (CBC) 212  Result(s) reported on 08 Jan 2013 at 06:48PM.  MCV 72  MCH 22.9  MCHC 31.7  RDW 15.8   Assessment & Diagnosis: Axis I: Polysubstance Dependence.   Axis III: HIV, Acute Bronchitis.   Axis IV: Problems with primary support group  Problems related to social environment  Problems accessing health care .  Treatment Plan: Case discussed with Dr Arlan Organ and he agreed to re-evalute the pt for medical clearence.  Mr Jimmye Norman will refer the pt to [facility] for placement once stable.  No meds will be prescribed as he does not appear to have any acute w/d symptoms at this time.  Pt will follow up with [facility] clinic for HIV.  He demonstarted understanding.  Electronic Signatures: Jeronimo Norma  (MD)  (Signed 20-Feb-14 14:06)  Authored: Chief Complaint, Presenting Symptoms, History of Present Illness, Target Symptoms, Substance Abuse History, PAST MEDICAL & SURGICAL HX, CURRENT OUTPATIENT MEDICATIONS, Mental Status Exam, Suicide Risk Assessment, Review of Systems, NURSING FLOWSHEETS, LAB, Assessment & Diagnosis, Treatment Plan   Last Updated: 20-Feb-14 14:06 by Jeronimo Norma (MD)

## 2015-03-12 NOTE — Discharge Summary (Signed)
PATIENT NAME:  Alan Arnold, Alan Arnold MR#:  [id number] DATE OF BIRTH:  10-14-1966  DATE OF ADMISSION:  07/07/2013 DATE OF DISCHARGE:  07/15/2013  HOSPITAL COURSE: See dictated history and physical for details of admission. This 49 year old man with a history of polysubstance dependence and mood problems presented to the hospital requesting detox. He had been relapsed into recent heavy drinking. It had affected his mood such that he was fighting with his girlfriend. She had thrown him out. He had also been using cocaine. He was starting to feel depressed. He endorsed some passive suicidal ideation without specific intent. In the hospital, the patient was treated with the usual detox protocol for alcohol. He detoxed without difficulty. He became more engaged gradually in groups on the unit. His mood was treated with fluoxetine 40 mg a day, risperidone 1 mg a day and trazodone 100 mg at night, which apparently had been helpful for stabilizing his mood in the past. He tolerated all this without difficulty. His affect and mood gradually improved, and he became more outgoing and relaxed. He denied any suicidal ideation later in his hospital stay. The patient was interested in trying to find some kind of rehab program, but when it was discovered that he currently has legal problems, being in violation of his probation, it appeared that he was not going to be eligible for any other kind of services. It was recommended to him that he follow up in the community with [facility]. He was agreeable to that. His plan was to stay either with another girlfriend in the area or a friend, and he appeared to be certain he could find safe living situations. Consultation was obtained with infectious disease while he was in the hospital because of his HIV positive status. The upshot of that was that he would [facility] and did not need to restart any HIV medicine at this point because his lab values were in the safe enough range.   MENTAL  STATUS EXAMINATION AT DISCHARGE: Neatly dressed and groomed man, looks his stated age. Cooperative with the interview. Good eye contact. Normal psychomotor activity. Speech is normal in rate, tone and volume. Affect is euthymic, reactive, appropriate. Mood is stated as good. Thoughts appear to be lucid, without any loosening of associations or delusions. Denies auditory or visual hallucinations. Denies suicidal or homicidal ideation. Shows improved judgment and insight. Normal intelligence. Alert and oriented x4.   LABORATORY DATA: His absolute T4 helper cell count was 585, which is within the normal range. TSH normal at 0.65. Admission drug screen positive for cocaine. Urinalysis unremarkable. Alcohol low at 24. Chemistry showed no significant abnormalities. CBC: No significant abnormalities.   DISCHARGE MEDICATIONS: Fluoxetine 40 mg p.o. daily, Risperdal 1 mg p.o. at bedtime, trazodone 100 mg p.o. at bedtime.   DIAGNOSIS, PRINCIPAL AND PRIMARY:  AXIS I: Alcohol dependence.   SECONDARY DIAGNOSIS:  AXIS I:  1. Cocaine dependence.  2. Substance-induced mood disorder, depressed.  AXIS II: Borderline and antisocial features.  AXIS III: Human immunodeficiency virus positive.  AXIS IV: Severe from temporary homelessness, lack of social support.  AXIS V: Functioning at time of discharge 55.   ____________________________ Audery AmelJohn Arnold. Khalfani Weideman, MD jtc:OSi D: 07/28/2013 22:48:11 ET Arnold: 07/29/2013 07:01:09 ET JOB#: [id number]  cc: Audery AmelJohn Arnold. Nancylee Gaines, MD, <Dictator> Audery AmelJOHN Arnold Jansen Goodpasture MD ELECTRONICALLY SIGNED 07/29/2013 9:52

## 2015-03-12 NOTE — Consult Note (Signed)
PATIENT NAME:  Alan Arnold, Alan Arnold MR#:  [id number] DATE OF BIRTH:  Sep 04, 1966  DATE OF CONSULTATION:  07/09/2013  CONSULTING PHYSICIAN:  Stann Mainlandavid P. Sampson GoonFitzgerald, MD  REQUESTING PHYSICIAN: Dr. Toni Amendlapacs in psychiatry.   REASON FOR CONSULTATION: HIV infection.   HISTORY OF PRESENT ILLNESS: This is a pleasant 49 year old gentleman with a history of depression, polysubstance abuse and HIV, who was admitted 08/18 for detox from alcohol and cocaine. He has been admitted and is doing relatively well with detox. Per the patient, he has had HIV since 2004 and has [facility] with Dr. Glendell DockerByrd Quinlivan since that time. She has not suggested that he go on ARVs at this point but he has not seen them in over a year. He currently has no fevers, night sweats, cough, shortness of breath, thrush, oral sores, difficulty swallowing. He does have mild occasional loose stools but no abdominal pain, nausea or vomiting. He has no dysuria.   PAST MEDICAL HISTORY: 1.  HIV as above.  2.  Depression.  3.  Substance abuse.  4.  Hepatitis, although he is not sure if it is B or C.   SOCIAL HISTORY: The patient is unemployed. He has applied for disability. He is divorced. His wife has HIV but is on medications and [facility]. He has had several girlfriends recently. He uses alcohol and cocaine as above.   FAMILY HISTORY: Noncontributory.   CURRENT MEDICATIONS: None.   ALLERGIES: No known drug allergies.   REVIEW OF SYSTEMS:  11 systems reviewed,  negative except as per history of present illness.   PHYSICAL EXAMINATION: VITAL SIGNS: Afebrile, temperature of 98, pulse 55, blood pressure 121/81.  GENERAL: He is pleasant, interactive, in no acute distress, walking well, well-dressed.  HEENT: Pupils equal, round and reactive to light and accommodation. Extraocular movements are intact. Sclerae anicteric. Oropharynx: Clear.  NECK: Supple.  HEART: Regular.  LUNGS: Clear to auscultation bilaterally.  ABDOMEN: Soft,  nontender, nondistended. No hepatosplenomegaly.  EXTREMITIES: No clubbing, cyanosis or edema.  SKIN: He has multiple tattoos.  NEUROLOGIC:  He is alert and oriented x3.  Grossly nonfocal neuro exam.   LABORATORY DATA: White blood count on admission 5.4, hemoglobin 14.0, platelets 173. Basic metabolic panel and liver tests are normal except for slightly elevated AST of 39. TSH is slightly low at 0.429. Urinalysis is negative.   IMPRESSION: A 49 year old with human immunodeficiency virus, unknown CD4 count, [facility], admitted with depression and detox.   RECOMMENDATIONS: 1.  Check a CD4 today.  2.  Check an RPR.  3.  I discussed with the patient that it would be best if [facility]. He is already connected with [facility] the local HIV resource center. They can help provide transportation.  4.  We will see him again once the CD4 is back to see if there are any other interventions  needed. We will start no medications at this time.   Thank you for the consult. I will be glad to follow with you.    ____________________________ Stann Mainlandavid P. Sampson GoonFitzgerald, MD dpf:dp D: 07/09/2013 15:59:26 ET Arnold: 07/09/2013 16:35:26 ET JOB#: [id number]  cc: Stann Mainlandavid P. Sampson GoonFitzgerald, MD, <Dictator> Navy Roths[place] Sampson GoonFITZGERALD MD ELECTRONICALLY SIGNED 07/11/2013 19:22

## 2015-10-28 ENCOUNTER — Emergency Department
Admission: EM | Admit: 2015-10-28 | Discharge: 2015-10-28 | Disposition: A | Discharge status: Home / Self Care | Payer: Self-pay | Attending: Emergency Medicine | Admitting: Emergency Medicine

## 2015-10-28 ENCOUNTER — Encounter: Payer: Self-pay | Admitting: *Deleted

## 2015-10-28 DIAGNOSIS — Y998 Other external cause status: Secondary | ICD-10-CM | POA: Insufficient documentation

## 2015-10-28 DIAGNOSIS — Y9289 Other specified places as the place of occurrence of the external cause: Secondary | ICD-10-CM | POA: Insufficient documentation

## 2015-10-28 DIAGNOSIS — K029 Dental caries, unspecified: Secondary | ICD-10-CM | POA: Insufficient documentation

## 2015-10-28 DIAGNOSIS — K047 Periapical abscess without sinus: Secondary | ICD-10-CM | POA: Insufficient documentation

## 2015-10-28 DIAGNOSIS — F17200 Nicotine dependence, unspecified, uncomplicated: Secondary | ICD-10-CM | POA: Insufficient documentation

## 2015-10-28 DIAGNOSIS — Y9389 Activity, other specified: Secondary | ICD-10-CM | POA: Insufficient documentation

## 2015-10-28 DIAGNOSIS — J069 Acute upper respiratory infection, unspecified: Secondary | ICD-10-CM | POA: Insufficient documentation

## 2015-10-28 DIAGNOSIS — S39012A Strain of muscle, fascia and tendon of lower back, initial encounter: Secondary | ICD-10-CM | POA: Insufficient documentation

## 2015-10-28 DIAGNOSIS — R197 Diarrhea, unspecified: Secondary | ICD-10-CM | POA: Insufficient documentation

## 2015-10-28 DIAGNOSIS — K0381 Cracked tooth: Secondary | ICD-10-CM | POA: Insufficient documentation

## 2015-10-28 DIAGNOSIS — X58XXXA Exposure to other specified factors, initial encounter: Secondary | ICD-10-CM | POA: Insufficient documentation

## 2015-10-28 DIAGNOSIS — B20 Human immunodeficiency virus [HIV] disease: Secondary | ICD-10-CM | POA: Insufficient documentation

## 2015-10-28 HISTORY — DX: Human immunodeficiency virus (HIV) disease: B20

## 2015-10-28 HISTORY — DX: Bronchiectasis with (acute) exacerbation: J47.1

## 2015-10-28 HISTORY — DX: Asymptomatic human immunodeficiency virus (hiv) infection status: Z21

## 2015-10-28 MED ORDER — AMOXICILLIN 500 MG PO TABS: 500.0000 mg | ORAL_TABLET | Freq: Three times a day (TID) | ORAL | Status: DC

## 2015-10-28 MED ORDER — CYCLOBENZAPRINE HCL 10 MG PO TABS: 10.0000 mg | ORAL_TABLET | Freq: Three times a day (TID) | ORAL | Status: DC | PRN

## 2015-10-28 MED ORDER — OXYCODONE-ACETAMINOPHEN 5-325 MG PO TABS: 1.0000 | ORAL_TABLET | Freq: Four times a day (QID) | ORAL | Status: DC | PRN

## 2015-10-28 MED ORDER — AMOXICILLIN 500 MG PO CAPS
500.0000 mg | ORAL_CAPSULE | Freq: Once | ORAL | Status: AC
  Administered 2015-10-28: 500 mg via ORAL
  Filled 2015-10-28: qty 1

## 2015-10-28 NOTE — Discharge Instructions (Signed)
Dental Abscess A dental abscess is a collection of pus in or around a tooth. CAUSES This condition is caused by a bacterial infection around the root of the tooth that involves the inner part of the tooth (pulp). It may result from:  Severe tooth decay.  Trauma to the tooth that allows bacteria to enter into the pulp, such as a broken or chipped tooth.  Severe gum disease around a tooth. SYMPTOMS Symptoms of this condition include:  Severe pain in and around the infected tooth.  Swelling and redness around the infected tooth, in the mouth, or in the face.  Tenderness.  Pus drainage.  Bad breath.  Bitter taste in the mouth.  Difficulty swallowing.  Difficulty opening the mouth.  Nausea.  Vomiting.  Chills.  Swollen neck glands.  Fever. DIAGNOSIS This condition is diagnosed with examination of the infected tooth. During the exam, your dentist may tap on the infected tooth. Your dentist will also ask about your medical and dental history and may order X-rays. TREATMENT This condition is treated by eliminating the infection. This may be done with:  Antibiotic medicine.  A root canal. This may be performed to save the tooth.  Pulling (extracting) the tooth. This may also involve draining the abscess. This is done if the tooth cannot be saved. HOME CARE INSTRUCTIONS  Take medicines only as directed by your dentist.  If you were prescribed antibiotic medicine, finish all of it even if you start to feel better.  Rinse your mouth (gargle) often with salt water to relieve pain or swelling.  Do not drive or operate heavy machinery while taking pain medicine.  Do not apply heat to the outside of your mouth.  Keep all follow-up visits as directed by your dentist. This is important. SEEK MEDICAL CARE IF:  Your pain is worse and is not helped by medicine. SEEK IMMEDIATE MEDICAL CARE IF:  You have a fever or chills.  Your symptoms suddenly get worse.  You have a  very bad headache.  You have problems breathing or swallowing.  You have trouble opening your mouth.  You have swelling in your neck or around your eye.   This information is not intended to replace advice given to you by your health care provider. Make sure you discuss any questions you have with your health care provider.   Document Released: 11/06/2005 Document Revised: 03/23/2015 Document Reviewed: 11/03/2014 Elsevier Interactive Patient Education 2016 [employer].  Low Back Strain With Rehab A strain is an injury in which a tendon or muscle is torn. The muscles and tendons of the lower back are vulnerable to strains. However, these muscles and tendons are very strong and require a great force to be injured. Strains are classified into three categories. Grade 1 strains cause pain, but the tendon is not lengthened. Grade 2 strains include a lengthened ligament, due to the ligament being stretched or partially ruptured. With grade 2 strains there is still function, although the function may be decreased. Grade 3 strains involve a complete tear of the tendon or muscle, and function is usually impaired. SYMPTOMS   Pain in the lower back.  Pain that affects one side more than the other.  Pain that gets worse with movement and may be felt in the hip, buttocks, or back of the thigh.  Muscle spasms of the muscles in the back.  Swelling along the muscles of the back.  Loss of strength of the back muscles.  Crackling sound (crepitation) when the  muscles are touched. CAUSES  Lower back strains occur when a force is placed on the muscles or tendons that is greater than they can handle. Common causes of injury include:  Prolonged overuse of the muscle-tendon units in the lower back, usually from incorrect posture.  A single violent injury or force applied to the back. RISK INCREASES WITH:  Sports that involve twisting forces on the spine or a lot of bending at the waist (football,  rugby, weightlifting, bowling, golf, tennis, speed skating, racquetball, swimming, running, gymnastics, diving).  Poor strength and flexibility.  Failure to warm up properly before activity.  Family history of lower back pain or disk disorders.  Previous back injury or surgery (especially fusion).  Poor posture with lifting, especially heavy objects.  Prolonged sitting, especially with poor posture. PREVENTION   Learn and use proper posture when sitting or lifting (maintain proper posture when sitting, lift using the knees and legs, not at the waist).  Warm up and stretch properly before activity.  Allow for adequate recovery between workouts.  Maintain physical fitness:  Strength, flexibility, and endurance.  Cardiovascular fitness. PROGNOSIS  If treated properly, lower back strains usually heal within 6 weeks. RELATED COMPLICATIONS   Recurring symptoms, resulting in a chronic problem.  Chronic inflammation, scarring, and partial muscle-tendon tear.  Delayed healing or resolution of symptoms.  Prolonged disability. TREATMENT  Treatment first involves the use of ice and medicine, to reduce pain and inflammation. The use of strengthening and stretching exercises may help reduce pain with activity. These exercises may be performed at home or with a therapist. Severe injuries may require referral to a therapist for further evaluation and treatment, such as ultrasound. Your caregiver may advise that you wear a back brace or corset, to help reduce pain and discomfort. Often, prolonged bed rest results in greater harm then benefit. Corticosteroid injections may be recommended. However, these should be reserved for the most serious cases. It is important to avoid using your back when lifting objects. At night, sleep on your back on a firm mattress with a pillow placed under your knees. If non-surgical treatment is unsuccessful, surgery may be needed.  MEDICATION   If pain medicine  is needed, nonsteroidal anti-inflammatory medicines (aspirin and ibuprofen), or other minor pain relievers (acetaminophen), are often advised.  Do not take pain medicine for 7 days before surgery.  Prescription pain relievers may be given, if your caregiver thinks they are needed. Use only as directed and only as much as you need.  Ointments applied to the skin may be helpful.  Corticosteroid injections may be given by your caregiver. These injections should be reserved for the most serious cases, because they may only be given a certain number of times. HEAT AND COLD  Cold treatment (icing) should be applied for 10 to 15 minutes every 2 to 3 hours for inflammation and pain, and immediately after activity that aggravates your symptoms. Use ice packs or an ice massage.  Heat treatment may be used before performing stretching and strengthening activities prescribed by your caregiver, physical therapist, or athletic trainer. Use a heat pack or a warm water soak. SEEK MEDICAL CARE IF:   Symptoms get worse or do not improve in 2 to 4 weeks, despite treatment.  You develop numbness, weakness, or loss of bowel or bladder function.  New, unexplained symptoms develop. (Drugs used in treatment may produce side effects.) EXERCISES  RANGE OF MOTION (ROM) AND STRETCHING EXERCISES - Low Back Strain Most people with lower  back pain will find that their symptoms get worse with excessive bending forward (flexion) or arching at the lower back (extension). The exercises which will help resolve your symptoms will focus on the opposite motion.  Your physician, physical therapist or athletic trainer will help you determine which exercises will be most helpful to resolve your lower back pain. Do not complete any exercises without first consulting with your caregiver. Discontinue any exercises which make your symptoms worse until you speak to your caregiver.  If you have pain, numbness or tingling which travels  down into your buttocks, leg or foot, the goal of the therapy is for these symptoms to move closer to your back and eventually resolve. Sometimes, these leg symptoms will get better, but your lower back pain may worsen. This is typically an indication of progress in your rehabilitation. Be very alert to any changes in your symptoms and the activities in which you participated in the 24 hours prior to the change. Sharing this information with your caregiver will allow him/her to most efficiently treat your condition.  These exercises may help you when beginning to rehabilitate your injury. Your symptoms may resolve with or without further involvement from your physician, physical therapist or athletic trainer. While completing these exercises, remember:  Restoring tissue flexibility helps normal motion to return to the joints. This allows healthier, less painful movement and activity.  An effective stretch should be held for at least 30 seconds.  A stretch should never be painful. You should only feel a gentle lengthening or release in the stretched tissue. FLEXION RANGE OF MOTION AND STRETCHING EXERCISES: STRETCH - Flexion, Single Knee to Chest   Lie on a firm bed or floor with both legs extended in front of you.  Keeping one leg in contact with the floor, bring your opposite knee to your chest. Hold your leg in place by either grabbing behind your thigh or at your knee.  Pull until you feel a gentle stretch in your lower back. Hold __________ seconds.  Slowly release your grasp and repeat the exercise with the opposite side. Repeat __________ times. Complete this exercise __________ times per day.  STRETCH - Flexion, Double Knee to Chest   Lie on a firm bed or floor with both legs extended in front of you.  Keeping one leg in contact with the floor, bring your opposite knee to your chest.  Tense your stomach muscles to support your back and then lift your other knee to your chest. Hold  your legs in place by either grabbing behind your thighs or at your knees.  Pull both knees toward your chest until you feel a gentle stretch in your lower back. Hold __________ seconds.  Tense your stomach muscles and slowly return one leg at a time to the floor. Repeat __________ times. Complete this exercise __________ times per day.  STRETCH - Low Trunk Rotation  Lie on a firm bed or floor. Keeping your legs in front of you, bend your knees so they are both pointed toward the ceiling and your feet are flat on the floor.  Extend your arms out to the side. This will stabilize your upper body by keeping your shoulders in contact with the floor.  Gently and slowly drop both knees together to one side until you feel a gentle stretch in your lower back. Hold for __________ seconds.  Tense your stomach muscles to support your lower back as you bring your knees back to the starting position. Repeat the  exercise to the other side. Repeat __________ times. Complete this exercise __________ times per day  EXTENSION RANGE OF MOTION AND FLEXIBILITY EXERCISES: STRETCH - Extension, Prone on Elbows   Lie on your stomach on the floor, a bed will be too soft. Place your palms about shoulder width apart and at the height of your head.  Place your elbows under your shoulders. If this is too painful, stack pillows under your chest.  Allow your body to relax so that your hips drop lower and make contact more completely with the floor.  Hold this position for __________ seconds.  Slowly return to lying flat on the floor. Repeat __________ times. Complete this exercise __________ times per day.  RANGE OF MOTION - Extension, Prone Press Ups  Lie on your stomach on the floor, a bed will be too soft. Place your palms about shoulder width apart and at the height of your head.  Keeping your back as relaxed as possible, slowly straighten your elbows while keeping your hips on the floor. You may adjust the  placement of your hands to maximize your comfort. As you gain motion, your hands will come more underneath your shoulders.  Hold this position __________ seconds.  Slowly return to lying flat on the floor. Repeat __________ times. Complete this exercise __________ times per day.  RANGE OF MOTION- Quadruped, Neutral Spine   Assume a hands and knees position on a firm surface. Keep your hands under your shoulders and your knees under your hips. You may place padding under your knees for comfort.  Drop your head and point your tail bone toward the ground below you. This will round out your lower back like an angry cat. Hold this position for __________ seconds.  Slowly lift your head and release your tail bone so that your back sags into a large arch, like an old horse.  Hold this position for __________ seconds.  Repeat this until you feel limber in your lower back.  Now, find your "sweet spot." This will be the most comfortable position somewhere between the two previous positions. This is your neutral spine. Once you have found this position, tense your stomach muscles to support your lower back.  Hold this position for __________ seconds. Repeat __________ times. Complete this exercise __________ times per day.  STRENGTHENING EXERCISES - Low Back Strain These exercises may help you when beginning to rehabilitate your injury. These exercises should be done near your "sweet spot." This is the neutral, low-back arch, somewhere between fully rounded and fully arched, that is your least painful position. When performed in this safe range of motion, these exercises can be used for people who have either a flexion or extension based injury. These exercises may resolve your symptoms with or without further involvement from your physician, physical therapist or athletic trainer. While completing these exercises, remember:   Muscles can gain both the endurance and the strength needed for everyday  activities through controlled exercises.  Complete these exercises as instructed by your physician, physical therapist or athletic trainer. Increase the resistance and repetitions only as guided.  You may experience muscle soreness or fatigue, but the pain or discomfort you are trying to eliminate should never worsen during these exercises. If this pain does worsen, stop and make certain you are following the directions exactly. If the pain is still present after adjustments, discontinue the exercise until you can discuss the trouble with your caregiver. STRENGTHENING - Deep Abdominals, Pelvic Tilt  Lie on a firm  bed or floor. Keeping your legs in front of you, bend your knees so they are both pointed toward the ceiling and your feet are flat on the floor.  Tense your lower abdominal muscles to press your lower back into the floor. This motion will rotate your pelvis so that your tail bone is scooping upwards rather than pointing at your feet or into the floor.  With a gentle tension and even breathing, hold this position for __________ seconds. Repeat __________ times. Complete this exercise __________ times per day.  STRENGTHENING - Abdominals, Crunches   Lie on a firm bed or floor. Keeping your legs in front of you, bend your knees so they are both pointed toward the ceiling and your feet are flat on the floor. Cross your arms over your chest.  Slightly tip your chin down without bending your neck.  Tense your abdominals and slowly lift your trunk high enough to just clear your shoulder blades. Lifting higher can put excessive stress on the lower back and does not further strengthen your abdominal muscles.  Control your return to the starting position. Repeat __________ times. Complete this exercise __________ times per day.  STRENGTHENING - Quadruped, Opposite UE/LE Lift   Assume a hands and knees position on a firm surface. Keep your hands under your shoulders and your knees under your  hips. You may place padding under your knees for comfort.  Find your neutral spine and gently tense your abdominal muscles so that you can maintain this position. Your shoulders and hips should form a rectangle that is parallel with the floor and is not twisted.  Keeping your trunk steady, lift your right hand no higher than your shoulder and then your left leg no higher than your hip. Make sure you are not holding your breath. Hold this position __________ seconds.  Continuing to keep your abdominal muscles tense and your back steady, slowly return to your starting position. Repeat with the opposite arm and leg. Repeat __________ times. Complete this exercise __________ times per day.  STRENGTHENING - Lower Abdominals, Double Knee Lift  Lie on a firm bed or floor. Keeping your legs in front of you, bend your knees so they are both pointed toward the ceiling and your feet are flat on the floor.  Tense your abdominal muscles to brace your lower back and slowly lift both of your knees until they come over your hips. Be certain not to hold your breath.  Hold __________ seconds. Using your abdominal muscles, return to the starting position in a slow and controlled manner. Repeat __________ times. Complete this exercise __________ times per day.  POSTURE AND BODY MECHANICS CONSIDERATIONS - Low Back Strain Keeping correct posture when sitting, standing or completing your activities will reduce the stress put on different body tissues, allowing injured tissues a chance to heal and limiting painful experiences. The following are general guidelines for improved posture. Your physician or physical therapist will provide you with any instructions specific to your needs. While reading these guidelines, remember:  The exercises prescribed by your provider will help you have the flexibility and strength to maintain correct postures.  The correct posture provides the best environment for your joints to work.  All of your joints have less wear and tear when properly supported by a spine with good posture. This means you will experience a healthier, less painful body.  Correct posture must be practiced with all of your activities, especially prolonged sitting and standing. Correct posture is as important  when doing repetitive low-stress activities (typing) as it is when doing a single heavy-load activity (lifting). RESTING POSITIONS Consider which positions are most painful for you when choosing a resting position. If you have pain with flexion-based activities (sitting, bending, stooping, squatting), choose a position that allows you to rest in a less flexed posture. You would want to avoid curling into a fetal position on your side. If your pain worsens with extension-based activities (prolonged standing, working overhead), avoid resting in an extended position such as sleeping on your stomach. Most people will find more comfort when they rest with their spine in a more neutral position, neither too rounded nor too arched. Lying on a non-sagging bed on your side with a pillow between your knees, or on your back with a pillow under your knees will often provide some relief. Keep in mind, being in any one position for a prolonged period of time, no matter how correct your posture, can still lead to stiffness. PROPER SITTING POSTURE In order to minimize stress and discomfort on your spine, you must sit with correct posture. Sitting with good posture should be effortless for a healthy body. Returning to good posture is a gradual process. Many people can work toward this most comfortably by using various supports until they have the flexibility and strength to maintain this posture on their own. When sitting with proper posture, your ears will fall over your shoulders and your shoulders will fall over your hips. You should use the back of the chair to support your upper back. Your lower back will be in a neutral  position, just slightly arched. You may place a small pillow or folded towel at the base of your lower back for support.  When working at a desk, create an environment that supports good, upright posture. Without extra support, muscles tire, which leads to excessive strain on joints and other tissues. Keep these recommendations in mind: CHAIR:  A chair should be able to slide under your desk when your back makes contact with the back of the chair. This allows you to work closely.  The chair's height should allow your eyes to be level with the upper part of your monitor and your hands to be slightly lower than your elbows. BODY POSITION  Your feet should make contact with the floor. If this is not possible, use a foot rest.  Keep your ears over your shoulders. This will reduce stress on your neck and lower back. INCORRECT SITTING POSTURES  If you are feeling tired and unable to assume a healthy sitting posture, do not slouch or slump. This puts excessive strain on your back tissues, causing more damage and pain. Healthier options include:  Using more support, like a lumbar pillow.  Switching tasks to something that requires you to be upright or walking.  Talking a brief walk.  Lying down to rest in a neutral-spine position. PROLONGED STANDING WHILE SLIGHTLY LEANING FORWARD  When completing a task that requires you to lean forward while standing in one place for a long time, place either foot up on a stationary 2-4 inch high object to help maintain the best posture. When both feet are on the ground, the lower back tends to lose its slight inward curve. If this curve flattens (or becomes too large), then the back and your other joints will experience too much stress, tire more quickly, and can cause pain. CORRECT STANDING POSTURES Proper standing posture should be assumed with all daily activities, even if they  only take a few moments, like when brushing your teeth. As in sitting, your ears  should fall over your shoulders and your shoulders should fall over your hips. You should keep a slight tension in your abdominal muscles to brace your spine. Your tailbone should point down to the ground, not behind your body, resulting in an over-extended swayback posture.  INCORRECT STANDING POSTURES  Common incorrect standing postures include a forward head, locked knees and/or an excessive swayback. WALKING Walk with an upright posture. Your ears, shoulders and hips should all line-up. PROLONGED ACTIVITY IN A FLEXED POSITION When completing a task that requires you to bend forward at your waist or lean over a low surface, try to find a way to stabilize 3 out of 4 of your limbs. You can place a hand or elbow on your thigh or rest a knee on the surface you are reaching across. This will provide you more stability so that your muscles do not fatigue as quickly. By keeping your knees relaxed, or slightly bent, you will also reduce stress across your lower back. CORRECT LIFTING TECHNIQUES DO :   Assume a wide stance. This will provide you more stability and the opportunity to get as close as possible to the object which you are lifting.  Tense your abdominals to brace your spine. Bend at the knees and hips. Keeping your back locked in a neutral-spine position, lift using your leg muscles. Lift with your legs, keeping your back straight.  Test the weight of unknown objects before attempting to lift them.  Try to keep your elbows locked down at your sides in order get the best strength from your shoulders when carrying an object.  Always ask for help when lifting heavy or awkward objects. INCORRECT LIFTING TECHNIQUES DO NOT:   Lock your knees when lifting, even if it is a small object.  Bend and twist. Pivot at your feet or move your feet when needing to change directions.  Assume that you can safely pick up even a paper clip without proper posture.   This information is not intended to  replace advice given to you by your health care provider. Make sure you discuss any questions you have with your health care provider.   Document Released: 11/06/2005 Document Revised: 11/27/2014 Document Reviewed: 02/18/2009 Elsevier Interactive Patient Education 2016 [employer]  Upper Respiratory Infection, Adult Most upper respiratory infections (URIs) are caused by a virus. A URI affects the nose, throat, and upper air passages. The most common type of URI is often called "the common cold." HOME CARE   Take medicines only as told by your doctor.  Gargle warm saltwater or take cough drops to comfort your throat as told by your doctor.  Use a warm mist humidifier or inhale steam from a shower to increase air moisture. This may make it easier to breathe.  Drink enough fluid to keep your pee (urine) clear or pale yellow.  Eat soups and other clear broths.  Have a healthy diet.  Rest as needed.  Go back to work when your fever is gone or your doctor says it is okay.  You may need to stay home longer to avoid giving your URI to others.  You can also wear a face mask and wash your hands often to prevent spread of the virus.  Use your inhaler more if you have asthma.  Do not use any tobacco products, including cigarettes, chewing tobacco, or electronic cigarettes. If you need help quitting, ask  your doctor. GET HELP IF:  You are getting worse, not better.  Your symptoms are not helped by medicine.  You have chills.  You are getting more short of breath.  You have brown or red mucus.  You have yellow or brown discharge from your nose.  You have pain in your face, especially when you bend forward.  You have a fever.  You have puffy (swollen) neck glands.  You have pain while swallowing.  You have white areas in the back of your throat. GET HELP RIGHT AWAY IF:   You have very bad or constant:  Headache.  Ear pain.  Pain in your forehead, behind your eyes,  and over your cheekbones (sinus pain).  Chest pain.  You have long-lasting (chronic) lung disease and any of the following:  Wheezing.  Long-lasting cough.  Coughing up blood.  A change in your usual mucus.  You have a stiff neck.  You have changes in your:  Vision.  Hearing.  Thinking.  Mood. MAKE SURE YOU:   Understand these instructions.  Will watch your condition.  Will get help right away if you are not doing well or get worse.   This information is not intended to replace advice given to you by your health care provider. Make sure you discuss any questions you have with your health care provider.   Document Released: 04/24/2008 Document Revised: 03/23/2015 Document Reviewed: 02/11/2014 Elsevier Interactive Patient Education 2016 [employer]   Take antibiotic as directed. Use pain medicine as needed. Follow-up with a primary physician or urgent care if not improving. Return to the emergency room for any worsening symptoms.

## 2015-10-28 NOTE — ED Notes (Signed)
Pt states cough, congestion, body aches, and dental abscess, states he has no appetite

## 2015-10-28 NOTE — ED Notes (Signed)
Reviewed d/c instructions with patient, including back strengthening exercises and use of gargles and heat.  Reviewed prescriptions and follow-up care with patient.  Pt verbalized understanding.

## 2015-10-28 NOTE — ED Notes (Signed)
States cough and congestion for a few days   Dental pain and swelling to gumline..Marland Kitchen

## 2015-10-28 NOTE — ED Provider Notes (Signed)
[facility] Emergency Department Provider Note  ____________________________________________  Time seen: Approximately 7:09 PM  I have reviewed the triage vital signs and the nursing notes.   HISTORY  Chief Complaint Nasal Congestion; Chills; Generalized Body Aches; and Dental Pain    HPI Alan Arnold is a 49 y.o. male with history of HIV, [facility], not currently on medication, who presents with 2 days of dental pain and head congestion with fever and chills. He is also having active pain worse with movement. He reports excessive digging in his yard 2 days ago prior to back pain beginning. He has a mild cough. He has decreased appetite. His abdomen has been sore and he complains of mild diarrhea.   Past Medical History  Diagnosis Date  . Bronchiectasis with (acute) exacerbation (HCC)   . HIV (human immunodeficiency virus infection) (HCC)     There are no active problems to display for this patient.   History reviewed. No pertinent past surgical history.  Current Outpatient Rx  Name  Route  Sig  Dispense  Refill  . amoxicillin (AMOXIL) 500 MG tablet   Oral   Take 1 tablet (500 mg total) by mouth 3 (three) times daily.   30 tablet   0   . cyclobenzaprine (FLEXERIL) 10 MG tablet   Oral   Take 1 tablet (10 mg total) by mouth every 8 (eight) hours as needed for muscle spasms.   21 tablet   0   . oxyCODONE-acetaminophen (ROXICET) 5-325 MG tablet   Oral   Take 1 tablet by mouth every 6 (six) hours as needed.   20 tablet   0     Allergies Review of patient's allergies indicates no known allergies.  History reviewed. No pertinent family history.  Social History Social History  Substance Use Topics  . Smoking status: Current Every Day Smoker  . Smokeless tobacco: None  . Alcohol Use: None    Review of Systems Constitutional: fever/chills Eyes: No visual changes. ENT: No sore throat. Cardiovascular: Denies chest  pain. Respiratory: Denies shortness of breath. Gastrointestinal: mild  abdominal pain.  No nausea, no vomiting.  Positive diarrhea.  No constipation. Genitourinary: Negative for dysuria. Musculoskeletal: Negative for back pain. Skin: Negative for rash. Neurological: Negative for headaches, focal weakness or numbness. 10-point ROS otherwise negative.  ____________________________________________   PHYSICAL EXAM:  VITAL SIGNS: ED Triage Vitals  Enc Vitals Group     BP 10/28/15 1832 129/92 mmHg     Pulse Rate 10/28/15 1832 93     Resp 10/28/15 1832 20     Temp 10/28/15 1832 97.7 F (36.5 C)     Temp src --      SpO2 10/28/15 1832 98 %     Weight 10/28/15 1832 220 lb (99.791 kg)     Height 10/28/15 1832 6\' 2"  (1.88 m)     Head Cir --      Peak Flow --      Pain Score 10/28/15 1834 6     Pain Loc --      Pain Edu? --      Excl. in GC? --     Constitutional: Alert and oriented. Well appearing and in no acute distress. Eyes: Conjunctivae are normal. PERRL. EOMI. Ears:  Clear with normal landmarks. No erythema. Head: Atraumatic. Nose: No congestion/rhinnorhea. Mouth/Throat: Mucous membranes are moist.  Oropharynx erythematous. No lesions. Fractured tooth left lower incisor with dental caries and gum tenderness. No fluctuance to the gum  area. Neck:  Supple.  No adenopathy.   Cardiovascular: Normal rate, regular rhythm. Grossly normal heart sounds.  Good peripheral circulation. Respiratory: Normal respiratory effort.  No retractions. Lungs CTAB. Gastrointestinal: Soft and nontender. No distention. No abdominal bruits. No CVA tenderness. Musculoskeletal: Nml ROM of upper and lower extremity joints. Tender over the paralumbar muscles. No spinal tenderness. Pain worse with range of motion of the lumbar spine. Neurologic:  Normal speech and language. No gross focal neurologic deficits are appreciated. No gait instability. Skin:  Skin is warm, dry and intact. No rash  noted. Psychiatric: Mood and affect are normal. Speech and behavior are normal.  ____________________________________________   LABS (all labs ordered are listed, but only abnormal results are displayed)  Labs Reviewed - No data to display ____________________________________________  EKG   ____________________________________________  RADIOLOGY   ____________________________________________   PROCEDURES  Procedure(s) performed: None  Critical Care performed: No  ____________________________________________   INITIAL IMPRESSION / ASSESSMENT AND PLAN / ED COURSE  Pertinent labs & imaging results that were available during my care of the patient were reviewed by me and considered in my medical decision making (see chart for details).  49 year old male with history of HIV who presents with dental pain, back pain, head congestion and myalgias. He has felt chilled at times. Treated with amoxicillin for early dental abscess. 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 He may continue over-the-counter ibuprofen. Also given Flexeril and Percocet. Strongly encouraged close follow-up with a primary physician, or returning to the emergency room for any worsening symptoms. He is followed by infectious disease at [facility] last visit in June. He is not currently on medication, and has not been since his original diagnosis over 15 years ago. ____________________________________________   FINAL CLINICAL IMPRESSION(S) / ED DIAGNOSES  Final diagnoses:  Dental abscess  Low back strain, initial encounter  Acute URI  HIV (human immunodeficiency virus infection) [facility])      Ignacia Bayley, PA-C 10/28/15 1915  Loleta Rose, MD 10/29/15 7829

## 2016-02-03 ENCOUNTER — Emergency Department
Admission: EM | Admit: 2016-02-03 | Discharge: 2016-02-03 | Disposition: A | Discharge status: Home / Self Care | Payer: Self-pay | Attending: Emergency Medicine | Admitting: Emergency Medicine

## 2016-02-03 ENCOUNTER — Encounter: Payer: Self-pay | Admitting: Emergency Medicine

## 2016-02-03 DIAGNOSIS — F17200 Nicotine dependence, unspecified, uncomplicated: Secondary | ICD-10-CM | POA: Insufficient documentation

## 2016-02-03 DIAGNOSIS — Y92410 Unspecified street and highway as the place of occurrence of the external cause: Secondary | ICD-10-CM | POA: Insufficient documentation

## 2016-02-03 DIAGNOSIS — Y998 Other external cause status: Secondary | ICD-10-CM | POA: Insufficient documentation

## 2016-02-03 DIAGNOSIS — S59911A Unspecified injury of right forearm, initial encounter: Secondary | ICD-10-CM | POA: Insufficient documentation

## 2016-02-03 DIAGNOSIS — S6991XA Unspecified injury of right wrist, hand and finger(s), initial encounter: Secondary | ICD-10-CM | POA: Insufficient documentation

## 2016-02-03 DIAGNOSIS — Y9389 Activity, other specified: Secondary | ICD-10-CM | POA: Insufficient documentation

## 2016-02-03 LAB — CBC WITH DIFFERENTIAL/PLATELET
Basophils Absolute: 0 10*3/uL (ref 0–0.1)
Basophils Relative: 0 %
EOS ABS: 0 10*3/uL (ref 0–0.7)
EOS PCT: 1 %
HCT: 44.8 % (ref 40.0–52.0)
Hemoglobin: 14.4 g/dL (ref 13.0–18.0)
LYMPHS ABS: 2.5 10*3/uL (ref 1.0–3.6)
LYMPHS PCT: 38 %
MCH: 23.5 pg — AB (ref 26.0–34.0)
MCHC: 32.1 g/dL (ref 32.0–36.0)
MCV: 73.3 fL — AB (ref 80.0–100.0)
MONO ABS: 0.7 10*3/uL (ref 0.2–1.0)
MONOS PCT: 10 %
Neutro Abs: 3.4 10*3/uL (ref 1.4–6.5)
Neutrophils Relative %: 51 %
PLATELETS: 210 10*3/uL (ref 150–440)
RBC: 6.12 MIL/uL — AB (ref 4.40–5.90)
RDW: 16.4 % — AB (ref 11.5–14.5)
WBC: 6.6 10*3/uL (ref 3.8–10.6)

## 2016-02-03 LAB — BASIC METABOLIC PANEL
Anion gap: 7 (ref 5–15)
BUN: 13 mg/dL (ref 6–20)
CALCIUM: 9.6 mg/dL (ref 8.9–10.3)
CO2: 23 mmol/L (ref 22–32)
CREATININE: 1.09 mg/dL (ref 0.61–1.24)
Chloride: 108 mmol/L (ref 101–111)
GFR calc Af Amer: >60 mL/min (ref >60)
GLUCOSE: 116 mg/dL — AB (ref 65–99)
POTASSIUM: 4.3 mmol/L (ref 3.5–5.1)
SODIUM: 138 mmol/L (ref 135–145)

## 2016-02-03 NOTE — ED Notes (Signed)
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  Swelling noted to cuticle area and right arm just above elbow.  Small dime sized area palpated medially just above right elbow, painful at light palpation.

## 2017-10-15 ENCOUNTER — Encounter: Payer: Self-pay | Admitting: Intensive Care

## 2017-10-15 ENCOUNTER — Emergency Department
Admission: EM | Admit: 2017-10-15 | Discharge: 2017-10-15 | Disposition: A | Discharge status: Home / Self Care | Payer: Self-pay | Attending: Student in an Organized Health Care Education/Training Program | Admitting: Student in an Organized Health Care Education/Training Program

## 2017-10-15 ENCOUNTER — Other Ambulatory Visit: Payer: Self-pay

## 2017-10-15 ENCOUNTER — Emergency Department
Admission: EM | Admit: 2017-10-15 | Discharge: 2017-10-15 | Discharge status: Against Medical Advice | Payer: Self-pay | Attending: Emergency Medicine | Admitting: Emergency Medicine

## 2017-10-15 DIAGNOSIS — R339 Retention of urine, unspecified: Secondary | ICD-10-CM | POA: Insufficient documentation

## 2017-10-15 DIAGNOSIS — Z5321 Procedure and treatment not carried out due to patient leaving prior to being seen by health care provider: Secondary | ICD-10-CM | POA: Insufficient documentation

## 2017-10-15 DIAGNOSIS — F17200 Nicotine dependence, unspecified, uncomplicated: Secondary | ICD-10-CM | POA: Insufficient documentation

## 2017-10-15 DIAGNOSIS — B20 Human immunodeficiency virus [HIV] disease: Secondary | ICD-10-CM | POA: Insufficient documentation

## 2017-10-15 LAB — URINALYSIS, COMPLETE (UACMP) WITH MICROSCOPIC
Bacteria, UA: NONE SEEN
Bilirubin Urine: NEGATIVE
GLUCOSE, UA: NEGATIVE mg/dL
HGB URINE DIPSTICK: NEGATIVE
Ketones, ur: NEGATIVE mg/dL
Leukocytes, UA: NEGATIVE
NITRITE: NEGATIVE
PH: 5 (ref 5.0–8.0)
Protein, ur: NEGATIVE mg/dL
SPECIFIC GRAVITY, URINE: 1.011 (ref 1.005–1.030)
Squamous Epithelial / LPF: NONE SEEN

## 2017-10-15 MED ORDER — TAMSULOSIN HCL 0.4 MG PO CAPS: 0.4000 mg | ORAL_CAPSULE | Freq: Every day | ORAL | 0 refills | Status: DC

## 2017-10-15 MED ORDER — LIDOCAINE HCL 2 % EX GEL
CUTANEOUS | Status: AC
  Administered 2017-10-15: 1 via URETHRAL
  Filled 2017-10-15: qty 10

## 2017-10-15 MED ORDER — LIDOCAINE HCL 2 % EX GEL
1.0000 "application " | Freq: Once | CUTANEOUS | Status: AC
  Administered 2017-10-15: 1 via URETHRAL

## 2017-10-15 NOTE — ED Triage Notes (Signed)
Patient reports having difficulty urinating since this afternoon.

## 2017-10-15 NOTE — ED Notes (Signed)
Put in foley catheter.

## 2017-10-15 NOTE — ED Notes (Signed)
Patient does not appear to be in any acute distress at time of discharge. Patient ambulatory to lobby with steady gate. Patient denies any comments or concerns regarding discharge.  

## 2017-10-15 NOTE — ED Notes (Signed)
Leg bag in place. Patient given instructions how to use leg bag.

## 2017-10-15 NOTE — ED Provider Notes (Signed)
[facility] Emergency Department Provider Note    None    (approximate)  I have reviewed the triage vital signs and the nursing notes.   HISTORY  Chief Complaint Urinary Retention    HPI Alan Arnold is a 51 y.o. male the below listed chronic medical conditions presents with chief complaint of aching suprapubic pressure that is moderate to severe in nature.  Patient presented to the ER last night for similar symptoms.  He had a Foley catheter placed with drainage of nearly 1 L of urine.  Fortunately he left before he was able to be seen due to meeting with a "parole officer ".  Catheter was removed and is subsequently re-presented with similar symptoms.  Denies any fevers.  No blood in his urine.  No known history of prostate enlargement.  No new medications.  Past Medical History:  Diagnosis Date  . Bronchiectasis with (acute) exacerbation (HCC)   . HIV (human immunodeficiency virus infection) (HCC)    History reviewed. No pertinent family history. History reviewed. No pertinent surgical history. There are no active problems to display for this patient.     Prior to Admission medications   Medication Sig Start Date End Date Taking? Authorizing Provider  amoxicillin (AMOXIL) 500 MG tablet Take 1 tablet (500 mg total) by mouth 3 (three) times daily. 10/28/15   Ignacia Bayley, PA-C  cyclobenzaprine (FLEXERIL) 10 MG tablet Take 1 tablet (10 mg total) by mouth every 8 (eight) hours as needed for muscle spasms. 10/28/15   Ignacia Bayley, PA-C  oxyCODONE-acetaminophen (ROXICET) 5-325 MG tablet Take 1 tablet by mouth every 6 (six) hours as needed. 10/28/15   Ignacia Bayley, PA-C    Allergies Patient has no known allergies.    Social History Social History   Tobacco Use  . Smoking status: Current Some Day Smoker  . Smokeless tobacco: Never Used  Substance Use Topics  . Alcohol use: Yes    Comment: weekends  . Drug use: Not on file    Review of  Systems Patient denies headaches, rhinorrhea, blurry vision, numbness, shortness of breath, chest pain, edema, cough, abdominal pain, nausea, vomiting, diarrhea, dysuria, fevers, rashes or hallucinations unless otherwise stated above in HPI. ____________________________________________   PHYSICAL EXAM:  VITAL SIGNS: Vitals:   10/15/17 0703  BP: (!) 153/109  Pulse: 98  Resp: 18  Temp: 98.4 F (36.9 C)  SpO2: 98%    Constitutional: Alert and oriented. Well appearing and in no acute distress. Eyes: Conjunctivae are normal.  Head: Atraumatic. Nose: No congestion/rhinnorhea. Mouth/Throat: Mucous membranes are moist.   Neck: No stridor. Painless ROM.  Cardiovascular: Normal rate, regular rhythm. Grossly normal heart sounds.  Good peripheral circulation. Respiratory: Normal respiratory effort.  No retractions. Lungs CTAB. Gastrointestinal: Soft and nontender. No distention. No abdominal bruits. No CVA tenderness. Genitourinary: normal external genitalia Musculoskeletal: No lower extremity tenderness nor edema.  No joint effusions. Neurologic:  Normal speech and language. No gross focal neurologic deficits are appreciated. No facial droop Skin:  Skin is warm, dry and intact. No rash noted. Psychiatric: Mood and affect are normal. Speech and behavior are normal.  ____________________________________________   LABS (all labs ordered are listed, but only abnormal results are displayed)  Results for orders placed or performed during the hospital encounter of 10/15/17 (from the past 24 hour(s))  Urinalysis, Complete w Microscopic     Status: Abnormal   Collection Time: 10/15/17  7:50 AM  Result Value Ref Range   Color, Urine  YELLOW (A) YELLOW   APPearance CLEAR (A) CLEAR   Specific Gravity, Urine 1.011 1.005 - 1.030   pH 5.0 5.0 - 8.0   Glucose, UA NEGATIVE NEGATIVE mg/dL   Hgb urine dipstick NEGATIVE NEGATIVE   Bilirubin Urine NEGATIVE NEGATIVE   Ketones, ur NEGATIVE NEGATIVE  mg/dL   Protein, ur NEGATIVE NEGATIVE mg/dL   Nitrite NEGATIVE NEGATIVE   Leukocytes, UA NEGATIVE NEGATIVE   RBC / HPF 0-5 0 - 5 RBC/hpf   WBC, UA 0-5 0 - 5 WBC/hpf   Bacteria, UA NONE SEEN NONE SEEN   Squamous Epithelial / LPF NONE SEEN NONE SEEN   Mucus PRESENT    ____________________________________________________________________________________   PROCEDURES  Procedure(s) performed:  Procedures    Critical Care performed: no ____________________________________________   INITIAL IMPRESSION / ASSESSMENT AND PLAN / ED COURSE  Pertinent labs & imaging results that were available during my care of the patient were reviewed by me and considered in my medical decision making (see chart for details).  DDX: bladder obstruction, uti, bph, urethral stricture  Alan Arnold is a 51 y.o. who presents to the ED with acute urinary retention as described above.  Has nearly 900ml in his bladder and is this is second distention injury in 24 hours and is unable to void will Place Foley catheter and start patient on Flomax.  Will check urine for infection though clinically seems less likely.  Will have patient follow up with urology.  Have discussed with the patient and available family all diagnostics and treatments performed thus far and all questions were answered to the best of my ability. The patient demonstrates understanding and agreement with plan.       ____________________________________________   FINAL CLINICAL IMPRESSION(S) / ED DIAGNOSES  Final diagnoses:  Urinary retention      NEW MEDICATIONS STARTED DURING THIS VISIT:  This SmartLink is deprecated. Use AVSMEDLIST instead to display the medication list for a patient.   Note:  This document was prepared using Dragon voice recognition software and may include unintentional dictation errors.    Alan Arnold, Alan Genova, MD 10/15/17 [phone]

## 2017-10-15 NOTE — ED Notes (Signed)
Patient states he has to leave because he has to meet his parole officer at 1:30 am.  Talked with patient and this RN offered to call parole officer and explain why he was at hospital, patient reports he does not have phone number he is supposed to meet him at his house.  This RN suggested sending family to house to have parole officer call the hospital and speak with this RN who would explain reason patient here.  Patient declined and stated he had to go.  Spoke with Dr. Dolores FrameSung regarding patient.  Patient instructed that because he was not going to be seeing a provider that foley cath would have to be removed.  Patient also instructed that if he had not urinated within 8 hours of having the catheter removed that he would need to seek medical attention.  Patient verbalized understanding.

## 2017-10-15 NOTE — ED Notes (Signed)
945 ml of urine on bladder scan.

## 2017-10-15 NOTE — ED Triage Notes (Signed)
Patient came in last night for Urinary retention and catheter was placed in triage. Patient left around 11:30 and triage nurses had to take catheter out and patient was never seen by provider. Bladder scan last night was around . Patient reports he has not voided since last night

## 2017-10-15 NOTE — ED Notes (Signed)
400 ml output urinary catheter

## 2017-10-15 NOTE — ED Notes (Signed)
700 ml drained via urinary catheter

## 2017-10-21 ENCOUNTER — Emergency Department
Admission: EM | Admit: 2017-10-21 | Discharge: 2017-10-21 | Disposition: A | Discharge status: Home / Self Care | Payer: Self-pay | Attending: Emergency Medicine | Admitting: Emergency Medicine

## 2017-10-21 DIAGNOSIS — Y658 Other specified misadventures during surgical and medical care: Secondary | ICD-10-CM | POA: Insufficient documentation

## 2017-10-21 DIAGNOSIS — T839XXA Unspecified complication of genitourinary prosthetic device, implant and graft, initial encounter: Secondary | ICD-10-CM | POA: Insufficient documentation

## 2017-10-21 DIAGNOSIS — Z960 Presence of urogenital implants: Secondary | ICD-10-CM | POA: Insufficient documentation

## 2017-10-21 DIAGNOSIS — Z79899 Other long term (current) drug therapy: Secondary | ICD-10-CM | POA: Insufficient documentation

## 2017-10-21 DIAGNOSIS — B20 Human immunodeficiency virus [HIV] disease: Secondary | ICD-10-CM | POA: Insufficient documentation

## 2017-10-21 DIAGNOSIS — R369 Urethral discharge, unspecified: Secondary | ICD-10-CM | POA: Insufficient documentation

## 2017-10-21 LAB — URINALYSIS, ROUTINE W REFLEX MICROSCOPIC
Bilirubin Urine: NEGATIVE
GLUCOSE, UA: NEGATIVE mg/dL
KETONES UR: NEGATIVE mg/dL
Nitrite: NEGATIVE
PH: 5 (ref 5.0–8.0)
Protein, ur: 100 mg/dL — AB
SPECIFIC GRAVITY, URINE: 1.023 (ref 1.005–1.030)

## 2017-10-21 MED ORDER — CEPHALEXIN 500 MG PO CAPS: 500.0000 mg | ORAL_CAPSULE | Freq: Two times a day (BID) | ORAL | 0 refills | Status: AC

## 2017-10-21 MED ORDER — CEPHALEXIN 500 MG PO CAPS
500.0000 mg | ORAL_CAPSULE | Freq: Once | ORAL | Status: AC
  Administered 2017-10-21: 500 mg via ORAL
  Filled 2017-10-21: qty 1

## 2017-10-21 NOTE — ED Provider Notes (Signed)
[facility] Emergency Department Provider Note ____________________________________________   First MD Initiated Contact with Patient 10/21/17 2012     (approximate)  I have reviewed the triage vital signs and the nursing notes.   HISTORY  Chief Complaint Urinary Retention    HPI Alan Arnold is a 51 y.o. male with past medical history as noted below who presents with Foley catheter problem, acute onset today when patient states he noticed pinkish strands in the tube of the catheter, and felt that it was not draining his urine as it had been previously.  He states that urine has been coming out around the tube for the last several hours.  Patient reports mild suprapubic discomfort associated with this.  He also reports small amount of whitish or yellowish discharge coming from the end of his penis.  He denies fever or chills.  Patient states that his problems began when he recently got out of prison, and found himself on 1126 to have urinary retention.  Patient states he came to the ER and had a Foley placed which drained approximately 1 L of urine.  He states that he had to leave to go to an appointment, and when he returned, the Foley was placed again which drained more urine.  The Foley was left in at that time, but patient states that he did not get adequate instructions as to when or how it should be taken out, or any follow-up.  He states he does not understand exactly why the problem happened in the first place.    Past Medical History:  Diagnosis Date  . Bronchiectasis with (acute) exacerbation (HCC)   . HIV (human immunodeficiency virus infection) (HCC)     There are no active problems to display for this patient.   History reviewed. No pertinent surgical history.  Prior to Admission medications   Medication Sig Start Date End Date Taking? Authorizing Provider  amoxicillin (AMOXIL) 500 MG tablet Take 1 tablet (500 mg total) by mouth 3 (three)  times daily. 10/28/15   Ignacia Bayleyumey, Robert, PA-C  cyclobenzaprine (FLEXERIL) 10 MG tablet Take 1 tablet (10 mg total) by mouth every 8 (eight) hours as needed for muscle spasms. 10/28/15   Ignacia Bayleyumey, Robert, PA-C  oxyCODONE-acetaminophen (ROXICET) 5-325 MG tablet Take 1 tablet by mouth every 6 (six) hours as needed. 10/28/15   Ignacia Bayleyumey, Robert, PA-C  tamsulosin (FLOMAX) 0.4 MG CAPS capsule Take 1 capsule (0.4 mg total) by mouth daily after supper. 10/15/17   Willy Eddyobinson, Patrick, MD    Allergies Patient has no known allergies.  History reviewed. No pertinent family history.  Social History Social History   Tobacco Use  . Smoking status: Current Some Day Smoker  . Smokeless tobacco: Never Used  Substance Use Topics  . Alcohol use: Yes    Comment: weekends  . Drug use: Not on file    Review of Systems  Constitutional: No fever. Eyes: No redness. ENT: No neck pain. Cardiovascular: Denies chest pain. Respiratory: Denies shortness of breath. Gastrointestinal: No nausea, no vomiting.   Genitourinary: Positive for urinary retention.  Musculoskeletal: Negative for back pain. Skin: Negative for rash. Neurological: Negative for headache.   ____________________________________________   PHYSICAL EXAM:  VITAL SIGNS: ED Triage Vitals  Enc Vitals Group     BP 10/21/17 1843 (!) 167/103     Pulse Rate 10/21/17 1843 94     Resp 10/21/17 1843 14     Temp 10/21/17 1843 97.8 F (36.6 C)  Temp src --      SpO2 10/21/17 1843 98 %     Weight 10/21/17 1844 240 lb (108.9 kg)     Height 10/21/17 1844 6\' 3"  (1.905 m)     Head Circumference --      Peak Flow --      Pain Score --      Pain Loc --      Pain Edu? --      Excl. in GC? --     Constitutional: Alert and oriented. Well appearing and in no acute distress. Eyes: Conjunctivae are normal.  Head: Atraumatic. Nose: No congestion/rhinnorhea. Mouth/Throat: Mucous membranes are moist.   Neck: Normal range of motion.  Cardiovascular: Good  peripheral circulation. Respiratory: Normal respiratory effort.   Gastrointestinal: Soft and nontender. No distention.  Genitourinary: No CVA tenderness.  Testes nontender.  Penis appears normal, and no significant discharge or drainage around the Foley. Musculoskeletal: No lower extremity edema.  Extremities warm and well perfused.  Neurologic:  Normal speech and language. No gross focal neurologic deficits are appreciated.  Skin:  Skin is warm and dry. No rash noted. Psychiatric: Mood and affect are normal. Speech and behavior are normal.  ____________________________________________   LABS (all labs ordered are listed, but only abnormal results are displayed)  Labs Reviewed  URINALYSIS, ROUTINE W REFLEX MICROSCOPIC - Abnormal; Notable for the following components:      Result Value   Color, Urine YELLOW (*)    APPearance HAZY (*)    Hgb urine dipstick MODERATE (*)    Protein, ur 100 (*)    Leukocytes, UA SMALL (*)    Bacteria, UA RARE (*)    Squamous Epithelial / LPF 0-5 (*)    All other components within normal limits   ____________________________________________  EKG   ____________________________________________  RADIOLOGY    ____________________________________________   PROCEDURES  Procedure(s) performed: No    Critical Care performed: No ____________________________________________   INITIAL IMPRESSION / ASSESSMENT AND PLAN / ED COURSE  Pertinent labs & imaging results that were available during my care of the patient were reviewed by me and considered in my medical decision making (see chart for details).  [age] year old male with recent Foley placement approximately 5 days ago for acute urinary retention presents with a problem with his Foley catheter, which does not appear to be draining normally and instead the urine is draining around the catheter.  Patient reports pink strands in the catheter tube and some discharge around the penile  meatus.  Review of past medical records in Epic confirms that the Foley was placed here on 11/26 for acute urinary tension and patient had drainage of approximately 1 L of urine at that time.  On exam, patient is comfortable appearing, vital signs are normal except for hypertension, and there is no visible discharge or drainage currently.  I suspect the patient likely has clogging of the catheter either from blood or possibly is developing an infection.  We will replace catheter, send a UA, and reassess.  I spent approximately 10 minutes explaining the pathophysiology of urinary retention and why patient likely had the initial episode, and I will ensure that he has adequate understanding of his discharge plan and follow-up once he is discharged.  ----------------------------------------- 9:40 PM on 10/21/2017 -----------------------------------------  The UA result is slightly ambiguous; there are leukocytes and RBCs but no significant WBCs.  Given the clinical picture with patient having whitish discharge from the meatus around the Foley, I think it is  most consistent with having some element of infection.  Patient now states, after being reminded by his wife, that he does in fact have urology follow-up scheduled 5 days for now.  I instructed him to follow-up at this appointment.  We will prescribe Keflex for possible infection for him to take until that time.  Patient given return precautions and I explained the discharge instructions to him verbally, and he expressed understanding.  I answered all of the patient and his wife's questions to the best of my ability.      ____________________________________________   FINAL CLINICAL IMPRESSION(S) / ED DIAGNOSES  Final diagnoses:  Problem with Foley catheter, initial encounter (HCC)      NEW MEDICATIONS STARTED DURING THIS VISIT:  This SmartLink is deprecated. Use AVSMEDLIST instead to display the medication list for a patient.   Note:   This document was prepared using Dragon voice recognition software and may include unintentional dictation errors.    Dionne Bucy, MD 10/21/17 2142

## 2017-10-21 NOTE — ED Notes (Signed)
Bladder scan 27ml

## 2017-10-21 NOTE — ED Triage Notes (Signed)
Pt presents c/o foley catheter being clogged. Reports "yellow jelly" type substance around tube where enters urethra. Pt reports some abd cramping. States tried to urinate earlier and urine came out of penis.

## 2017-10-21 NOTE — ED Notes (Signed)
ED Provider at bedside. 

## 2017-10-21 NOTE — ED Notes (Signed)
Pt has supplies and new leg bag to change collection container. Will leave large drainage bag at this time

## 2017-10-21 NOTE — ED Notes (Signed)
First Nurse Note: Pt c/o catheter problems. Pt in NAD at this time.

## 2017-10-21 NOTE — ED Notes (Signed)
Pt has had a urethral cath since last week and today he has leaking around the catheter. Pt really is unsure as to way he has this catheter. Pt states he did not have any follow up scheduled.

## 2017-10-21 NOTE — Discharge Instructions (Signed)
Follow-up at the appointment with the urologist on Friday as scheduled.  Take the antibiotic as prescribed until you see the urologist.  Return to the ER for any new or worsening symptoms including recurrent clogging of the catheter, inability to pass urine through the catheter, persistent urine or discharge coming around the catheter, abdominal pain, flank pain, fevers or chills, or any other new or worsening symptoms that concern you.

## 2017-10-22 ENCOUNTER — Ambulatory Visit: Payer: Self-pay | Admitting: Pharmacy Technician

## 2017-10-22 DIAGNOSIS — Z79899 Other long term (current) drug therapy: Secondary | ICD-10-CM

## 2017-10-23 NOTE — Progress Notes (Signed)
  Completed [facility] application and contract.  Patient agreed to all terms of the [facility] contract.   Patient is to complete HMAP application with Laurette SchimkeJulie Willis on 10/25/17.  [facility] will only be able to provide medication assistance for medications not covered by HMAP once patient is approved with HMAP.    Provided patient with Community education officercommunity resource material based on his particular needs.    Sherilyn DacostaBetty J. Kluttz Care Manager [facility]

## 2017-10-26 ENCOUNTER — Encounter: Payer: Self-pay | Admitting: Urology

## 2017-10-26 ENCOUNTER — Ambulatory Visit: Payer: Self-pay | Admitting: Urology

## 2017-11-07 ENCOUNTER — Encounter: Payer: Self-pay | Admitting: Urology

## 2017-11-07 ENCOUNTER — Ambulatory Visit (INDEPENDENT_AMBULATORY_CARE_PROVIDER_SITE_OTHER): Payer: Self-pay | Admitting: Urology

## 2017-11-07 VITALS — BP 131/86 | HR 71 | Ht 75.0 in | Wt 234.4 lb

## 2017-11-07 DIAGNOSIS — R339 Retention of urine, unspecified: Secondary | ICD-10-CM

## 2017-11-07 NOTE — Progress Notes (Signed)
Fill and Pull Catheter Removal  Patient is present today for a catheter removal.  Patient was cleaned and prepped in a sterile fashion 300ml of sterile water/ saline was instilled into the bladder when the patient felt the urge to urinate. 10ml of water was then drained from the balloon.  A 16FR foley cath was removed from the bladder no complications were noted .  Patient as then given some time to void on their own.  Patient can void  250ml on their own after some time.  Patient tolerated well.  Preformed by: Teressa Lowerarrie Marlaine Arey, CMA

## 2017-11-07 NOTE — Progress Notes (Signed)
11/07/2017 10:20 AM   Angie FavaVance T Pinette [birth year] [id number]  Referring provider: No referring provider defined for this encounter.  Chief Complaint  Patient presents with  . Urinary Retention    HPI: Alan Arnold is a 51 year old male seen in follow-up for urinary retention.  [facility]15/2017 with acute urinary retention.  A Foley catheter was placed with return of approximately 900 mL of clear urine.  He was given a prescription for Flomax and urology follow-up was recommended.  He was seen back in the ED on 12/2 complaining of problems with his Foley catheter which appeared to be related to some blood stranding in the catheter tubing and irritation of the meatus.  He was started empirically on Keflex.  He is not taking tamsulosin and it is unclear if he ever got this prescription filled.  He states 1-2 years ago he had an episode of urinary hesitancy, decreased stream which resolved and he did not require Foley catheter placement.  He has a history of HIV and cocaine abuse.   PMH: Past Medical History:  Diagnosis Date  . Bronchiectasis with (acute) exacerbation (HCC)   . HIV (human immunodeficiency virus infection) (HCC)     Surgical History: History reviewed. No pertinent surgical history.  Home Medications:  Allergies as of 11/07/2017   No Known Allergies     Medication List        Accurate as of 11/07/17 10:20 AM. Always use your most recent med list.          GENVOYA 150-150-200-10 MG Tabs tablet Generic drug:  elvitegravir-cobicistat-emtricitabine-tenofovir Take 1 tablet by mouth daily with breakfast.   tamsulosin 0.4 MG Caps capsule Commonly known as:  FLOMAX Take 1 capsule (0.4 mg total) by mouth daily after supper.       Allergies: No Known Allergies  Family History: Family History  Problem Relation Age of Onset  . Bladder Cancer Neg Hx   . Kidney cancer Neg Hx   . Prostate cancer Neg Hx     Social History:  reports that  he has been smoking.  he has never used smokeless tobacco. He reports that he drinks alcohol. He reports that he uses drugs. Drugs: Cocaine and "Crack" cocaine.  ROS: UROLOGY Frequent Urination?: No Hard to postpone urination?: No Burning/pain with urination?: No Get up at night to urinate?: No Leakage of urine?: No Urine stream starts and stops?: No Trouble starting stream?: No Do you have to strain to urinate?: Yes Blood in urine?: No Urinary tract infection?: No Sexually transmitted disease?: No Injury to kidneys or bladder?: No Painful intercourse?: No Weak stream?: No Erection problems?: No Penile pain?: No  Gastrointestinal Nausea?: No Vomiting?: No Indigestion/heartburn?: No Diarrhea?: No Constipation?: No  Constitutional Fever: No Night sweats?: No Weight loss?: No Fatigue?: No  Skin Skin rash/lesions?: No Itching?: No  Eyes Blurred vision?: No Double vision?: No  Ears/Nose/Throat Sore throat?: No Sinus problems?: No  Hematologic/Lymphatic Swollen glands?: No Easy bruising?: No  Cardiovascular Leg swelling?: No Chest pain?: No  Respiratory Cough?: No Shortness of breath?: No  Endocrine Excessive thirst?: No  Musculoskeletal Back pain?: No Joint pain?: No  Neurological Headaches?: No Dizziness?: No  Psychologic Depression?: No Anxiety?: No  Physical Exam: BP 131/86 (BP Location: Right Arm, Patient Position: Sitting, Cuff Size: Large)   Pulse 71   Ht 6\' 3"  (1.905 m)   Wt 234 lb 6.4 oz (106.3 kg)   BMI 29.30 kg/m   Constitutional:  Alert and oriented, No acute distress. HEENT: [place] AT, moist mucus membranes.  Trachea midline, no masses. Cardiovascular: No clubbing, cyanosis, or edema. Respiratory: Normal respiratory effort, no increased work of breathing. GI: Abdomen is soft, nontender, nondistended, no abdominal masses GU: No CVA tenderness.  Penis without lesions.  Foley catheter draining clear urine.  Testes descended  bilaterally without masses or tenderness.  Prostate flat, smooth without nodules or induration.  Estimated size 35 g. Skin: No rashes, bruises or suspicious lesions. Lymph: No cervical or inguinal adenopathy. Neurologic: Grossly intact, no focal deficits, moving all 4 extremities. Psychiatric: Normal mood and affect.  Laboratory Data: Lab Results  Component Value Date   WBC 6.6 02/03/2016   HGB 14.4 02/03/2016   HCT 44.8 02/03/2016   MCV 73.3 (L) 02/03/2016   PLT 210 02/03/2016    Lab Results  Component Value Date   CREATININE 1.09 02/03/2016     Assessment & Plan:    1. Urinary retention Catheter was removed for a voiding trial which was successful.  Recommend a one-month follow-up for bladder scan for PVR and PSA.  He was instructed to call earlier for recurrent voiding symptoms.   Riki AltesScott C Stoioff, MD  [facility] [address], [address] [place], [place] [address] [phone]

## 2017-12-31 ENCOUNTER — Telehealth: Payer: Self-pay | Admitting: Pharmacy Technician

## 2017-12-31 NOTE — Telephone Encounter (Signed)
Patient called stating that he has Medicaid.  Does not need medication [facility] any longer.  Sherilyn DacostaBetty J. Mikel Pyon Care Manager [facility]

## 2018-05-05 ENCOUNTER — Emergency Department
Admission: EM | Admit: 2018-05-05 | Discharge: 2018-05-05 | Disposition: A | Discharge status: Home / Self Care | Payer: Self-pay | Attending: Emergency Medicine | Admitting: Emergency Medicine

## 2018-05-05 ENCOUNTER — Other Ambulatory Visit: Payer: Self-pay

## 2018-05-05 ENCOUNTER — Emergency Department: Payer: Self-pay

## 2018-05-05 DIAGNOSIS — R400 Somnolence: Secondary | ICD-10-CM | POA: Insufficient documentation

## 2018-05-05 DIAGNOSIS — F17200 Nicotine dependence, unspecified, uncomplicated: Secondary | ICD-10-CM | POA: Insufficient documentation

## 2018-05-05 DIAGNOSIS — B20 Human immunodeficiency virus [HIV] disease: Secondary | ICD-10-CM | POA: Insufficient documentation

## 2018-05-05 DIAGNOSIS — F1910 Other psychoactive substance abuse, uncomplicated: Secondary | ICD-10-CM | POA: Diagnosis not present

## 2018-05-05 DIAGNOSIS — Z79899 Other long term (current) drug therapy: Secondary | ICD-10-CM | POA: Diagnosis not present

## 2018-05-05 DIAGNOSIS — Z046 Encounter for general psychiatric examination, requested by authority: Secondary | ICD-10-CM | POA: Diagnosis present

## 2018-05-05 LAB — COMPREHENSIVE METABOLIC PANEL
ALK PHOS: 40 U/L (ref 38–126)
ALT: 10 U/L — AB (ref 17–63)
AST: 21 U/L (ref 15–41)
Albumin: 4.2 g/dL (ref 3.5–5.0)
Anion gap: 10 (ref 5–15)
BILIRUBIN TOTAL: 1.1 mg/dL (ref 0.3–1.2)
BUN: 12 mg/dL (ref 6–20)
CO2: 24 mmol/L (ref 22–32)
CREATININE: 1.07 mg/dL (ref 0.61–1.24)
Calcium: 8.8 mg/dL — ABNORMAL LOW (ref 8.9–10.3)
Chloride: 105 mmol/L (ref 101–111)
GFR calc Af Amer: >60 mL/min (ref >60)
Glucose, Bld: 96 mg/dL (ref 65–99)
POTASSIUM: 3.8 mmol/L (ref 3.5–5.1)
Sodium: 139 mmol/L (ref 135–145)
TOTAL PROTEIN: 7.2 g/dL (ref 6.5–8.1)

## 2018-05-05 LAB — CBC
HCT: 46.1 % (ref 40.0–52.0)
Hemoglobin: 14.7 g/dL (ref 13.0–18.0)
MCH: 24.5 pg — ABNORMAL LOW (ref 26.0–34.0)
MCHC: 32 g/dL (ref 32.0–36.0)
MCV: 76.6 fL — ABNORMAL LOW (ref 80.0–100.0)
PLATELETS: 147 10*3/uL — AB (ref 150–440)
RBC: 6.01 MIL/uL — ABNORMAL HIGH (ref 4.40–5.90)
RDW: 17 % — AB (ref 11.5–14.5)
WBC: 5.7 10*3/uL (ref 3.8–10.6)

## 2018-05-05 LAB — ETHANOL

## 2018-05-05 MED ORDER — NALOXONE HCL 4 MG/0.1ML NA LIQD
1.0000 | Freq: Once | NASAL | Status: AC
  Administered 2018-05-05: 1 via NASAL

## 2018-05-05 MED ORDER — NALOXONE HCL 4 MG/0.1ML NA LIQD
1.0000 | Freq: Once | NASAL | Status: AC
  Administered 2018-05-05: 1 via NASAL
  Filled 2018-05-05: qty 4

## 2018-05-05 NOTE — ED Triage Notes (Signed)
Patient to ED in custody of [place] [employer].  Patient here for medical clearance for jail.  Patient appears very sleepy and PD concerned for possible ingestion.

## 2018-05-05 NOTE — ED Provider Notes (Signed)
[facility] Emergency Department Provider Note   ____________________________________________   First MD Initiated Contact with Patient 05/05/18 [phone]     (approximate)  I have reviewed the triage vital signs and the nursing notes.   HISTORY  Chief Complaint Medical Clearance  Patient with altered mental status unable to participate in history  HPI Alan FavaVance T Arnold is a 52 y.o. male who was brought into the hospital today with the police for medical clearance.  They report that he was arrested for cocaine charges but the jail was concerned that he may have ingested something.  He is very sleepy and not acting himself.  The police officer states that they have never seen him this way before so they were concerned and wanted to get him evaluated.  The patient blew a 0 on his breathalyzer and they wanted to make sure he did not have anything medically wrong.  The patient was not given any Narcan by the police.  He is just very sleepy.  He will not answer any questions or follow instructions.  Past Medical History:  Diagnosis Date  . Bronchiectasis with (acute) exacerbation (HCC)   . HIV (human immunodeficiency virus infection) (HCC)     There are no active problems to display for this patient.   No past surgical history on file.  Prior to Admission medications   Medication Sig Start Date End Date Taking? Authorizing Provider  elvitegravir-cobicistat-emtricitabine-tenofovir (GENVOYA) 150-150-200-10 MG TABS tablet Take 1 tablet by mouth daily with breakfast.    [provider]  tamsulosin (FLOMAX) 0.4 MG CAPS capsule Take 1 capsule (0.4 mg total) by mouth daily after supper. Patient not taking: Reported on 11/07/2017 10/15/17   Willy Eddyobinson, Patrick, MD    Allergies Patient has no known allergies.  Family History  Problem Relation Age of Onset  . Bladder Cancer Neg Hx   . Kidney cancer Neg Hx   . Prostate cancer Neg Hx     Social History Social  History   Tobacco Use  . Smoking status: Current Some Day Smoker  . Smokeless tobacco: Never Used  Substance Use Topics  . Alcohol use: Yes    Comment: weekends  . Drug use: Yes    Types: Cocaine, "Crack" cocaine    Review of Systems  Unable to assess due to patient somnolence and altered mental status    ____________________________________________   PHYSICAL EXAM:  VITAL SIGNS: ED Triage Vitals  Enc Vitals Group     BP 05/05/18 0442 134/86     Pulse Rate 05/05/18 0442 67     Resp 05/05/18 0442 16     Temp 05/05/18 0535 98.4 F (36.9 C)     Temp Source 05/05/18 0535 Oral     SpO2 05/05/18 0442 97 %     Weight 05/05/18 0441 200 lb (90.7 kg)     Height 05/05/18 0441 6\' 2"  (1.88 m)     Head Circumference --      Peak Flow --      Pain Score --      Pain Loc --      Pain Edu? --      Excl. in [place]? --     Constitutional: Patient somnolent but arousable, cannot stay awake long enough to answer questions. Eyes: Conjunctivae are normal. PERRL. EOMI. Head: Atraumatic. Nose: No congestion/rhinnorhea. Mouth/Throat: Mucous membranes are moist.  Oropharynx non-erythematous. Cardiovascular: Normal rate, regular rhythm. Grossly normal heart sounds.  Good peripheral circulation. Respiratory: Normal respiratory effort.  No retractions. Lungs CTAB. Gastrointestinal: Soft and nontender. No distention.  Positive bowel sounds Musculoskeletal: No lower extremity tenderness nor edema.  No joint effusions. Neurologic: Slurred speech, somnolent not following commands Skin:  Skin is warm, dry and intact.  Psychiatric: Patient somnolent  ____________________________________________   LABS (all labs ordered are listed, but only abnormal results are displayed)  Labs Reviewed  COMPREHENSIVE METABOLIC PANEL - Abnormal; Notable for the following components:      Result Value   Calcium 8.8 (*)    ALT 10 (*)    All other components within normal limits  CBC - Abnormal; Notable for  the following components:   RBC 6.01 (*)    MCV 76.6 (*)    MCH 24.5 (*)    RDW 17.0 (*)    Platelets 147 (*)    All other components within normal limits  ETHANOL  URINE DRUG SCREEN, QUALITATIVE ([facility] ONLY)   ____________________________________________  EKG  none ____________________________________________  RADIOLOGY  ED MD interpretation:  CT head: pending  Official radiology report(s): No results found.  ____________________________________________   PROCEDURES  Procedure(s) performed: None  Procedures  Critical Care performed: No  ____________________________________________   INITIAL IMPRESSION / ASSESSMENT AND PLAN / ED COURSE  As part of my medical decision making, I reviewed the following data within the electronic MEDICAL RECORD NUMBER Notes from prior ED visits and [place] Controlled Substance Database   This is a 52 year old male who comes into the hospital today in the custody of the police with some altered mental status.  The patient was arrested due to problems with drugs and they are concerned that he may be under the influence of some substances.  The patient was unable to answer any of my questions or tell me if he had any medical problems.  I did check some basic blood work to include a CBC, CMP, ethanol.  We did attempt to catheterize the patient for urine but he woke up and refused.  We did place a condom catheter on the patient to get some urine but he was still sleeping.  Given his somnolent I will perform a CT scan of the patient's head and await a urine drug screen.  The patient's care was signed out to Dr. Fanny Bien who will follow up the results of the CT scan of the urine drug screen and disposition the patient with the police if everything is negative or unremarkable.      ____________________________________________   FINAL CLINICAL IMPRESSION(S) / ED DIAGNOSES  Final diagnoses:  Somnolence  Substance abuse [facility])     ED Discharge Orders     None       Note:  This document was prepared using Dragon voice recognition software and may include unintentional dictation errors.    Rebecka Apley, MD 05/05/18 [phone]

## 2018-05-05 NOTE — ED Notes (Signed)
Patient refused in and out cath. Condom cath placed.

## 2018-05-05 NOTE — ED Notes (Signed)
Urine collected. Pt condom catheter taken off. MD states no longer needs urine sample

## 2018-05-05 NOTE — ED Notes (Signed)
Pt given po fluids and encouraged to provide urine.

## 2018-05-05 NOTE — ED Notes (Signed)
Pt discharged and released into police custody

## 2018-05-05 NOTE — ED Provider Notes (Addendum)
Ct Head Wo Contrast  Result Date: 05/05/2018 CLINICAL DATA:  Altered consciousness. EXAM: CT HEAD WITHOUT CONTRAST TECHNIQUE: Contiguous axial images were obtained from the base of the skull through the vertex without intravenous contrast. COMPARISON:  12/30/2011 FINDINGS: Brain: No mass lesion, hemorrhage, hydrocephalus, acute infarct, intra-axial, or extra-axial fluid collection. Vascular: No hyperdense vessel or unexpected calcification. Skull: No significant soft tissue swelling.  No skull fracture. Sinuses/Orbits: Normal imaged portions of the orbits and globes. Mucous retention cyst or polyp in the left sphenoid sinus is incompletely imaged. Clear mastoid air cells. Other: None. IMPRESSION: 1.  No acute intracranial abnormality. 2. Sinus disease. Electronically Signed   By: Jeronimo GreavesKyle  Talbot M.D.   On: 05/05/2018 07:40    ----------------------------------------- 8:12 AM on 05/05/2018 -----------------------------------------  Patient resting, but alerts to voice.  He is able to speak coherently, reports he does not really want to talk about what happened last night.  Reports that he did not sleep at all, and he knows he got arrested.  He reports he starting to feel better, but just needs little more rest.  He is alert, oriented, no distress.  [place] at bedside.  I will continue to observe him further as he remains slightly somnolent, but overall it appears his mental status is improving notably.   Sharyn CreamerQuale, Emberlynn Riggan, MD 05/05/18 0813   ----------------------------------------- 9:35 AM on 05/05/2018 -----------------------------------------  Patient now alert, ambulatory in the room without distress.  Alert and well-oriented.  Calm.  Being discharged into the ongoing care of [employer] [employer].  Patient appears well with stable hemodynamics and normal mental status.  Somnolence or altered mental status.    Sharyn CreamerQuale, Demarie Hyneman, MD 05/05/18 [phone]

## 2022-03-06 ENCOUNTER — Encounter: Payer: Self-pay | Admitting: *Deleted

## 2022-03-09 ENCOUNTER — Other Ambulatory Visit: Payer: Self-pay

## 2022-03-09 ENCOUNTER — Emergency Department
Admission: EM | Admit: 2022-03-09 | Discharge: 2022-03-09 | Disposition: A | Discharge status: Home / Self Care | Attending: Emergency Medicine | Admitting: Emergency Medicine

## 2022-03-09 DIAGNOSIS — Y990 Civilian activity done for income or pay: Secondary | ICD-10-CM | POA: Insufficient documentation

## 2022-03-09 DIAGNOSIS — S61211A Laceration without foreign body of left index finger without damage to nail, initial encounter: Secondary | ICD-10-CM | POA: Insufficient documentation

## 2022-03-09 DIAGNOSIS — W293XXA Contact with powered garden and outdoor hand tools and machinery, initial encounter: Secondary | ICD-10-CM | POA: Insufficient documentation

## 2022-03-09 DIAGNOSIS — Z23 Encounter for immunization: Secondary | ICD-10-CM | POA: Insufficient documentation

## 2022-03-09 MED ORDER — CEPHALEXIN 500 MG PO CAPS: 500.0000 mg | ORAL_CAPSULE | Freq: Four times a day (QID) | ORAL | 0 refills | Status: AC

## 2022-03-09 MED ORDER — TETANUS-DIPHTH-ACELL PERTUSSIS 5-2.5-18.5 LF-MCG/0.5 IM SUSY
0.5000 mL | PREFILLED_SYRINGE | Freq: Once | INTRAMUSCULAR | Status: AC
  Administered 2022-03-09: 0.5 mL via INTRAMUSCULAR
  Filled 2022-03-09: qty 0.5

## 2022-03-09 MED ORDER — LIDOCAINE HCL (PF) 1 % IJ SOLN
10.0000 mL | Freq: Once | INTRAMUSCULAR | Status: AC
  Administered 2022-03-09: 10 mL
  Filled 2022-03-09: qty 10

## 2022-03-09 NOTE — ED Triage Notes (Signed)
Pt here with a laceration to the pointer finger on his left hand after using hedge clippers. Bleeding controlled. ?

## 2022-03-09 NOTE — ED Notes (Signed)
Pt states he cut his finger on hedge clippers. Dressing in place, no drainage noted.  ?

## 2022-03-09 NOTE — Discharge Instructions (Addendum)
-  Please keep the bandage in place for least 24 to 48 hours.  Recommend changing dressing or keeping it covered as the laceration recovers. ?-The Dermabond solution will dissolve within 5 to 7 days.  No follow-up is needed. ?-Please take all of your antibiotics as prescribed. ?-Return to the emergency department anytime if you begin to experience any new or worsening symptoms. ?

## 2022-03-09 NOTE — ED Provider Notes (Signed)
? ?[facility] ?Provider Note ? ? ? Event Date/Time  ? First MD Initiated Contact with Patient 03/09/22 1431   ?  (approximate) ? ? ?History  ? ?Chief Complaint ?Laceration ? ? ?HPI ?Alan Arnold is a 56 y.o. male, no remarkable medical history, presents to the emergency department for evaluation of laceration.  Patient states that he was at work using hedge clippers when he accidentally tripped backwards causing the clippers to jump up and cut his left index finger.  Denies any other injuries at this time.  Patient states that his fall was mechanical, denies preceding symptoms.  Bleeding from laceration controlled with direct pressure.  He is unsure if he is up-to-date on his tetanus. ? ?History Limitations: No limitations. ? ?    ? ? ?Physical Exam  ?Triage Vital Signs: ?ED Triage Vitals  ?Enc Vitals Group  ?   BP 03/09/22 1402 (!) 133/100  ?   Pulse Rate 03/09/22 1402 78  ?   Resp 03/09/22 1402 18  ?   Temp 03/09/22 1402 97.9 ?F (36.6 ?C)  ?   Temp Source 03/09/22 1402 Oral  ?   SpO2 03/09/22 1402 97 %  ?   Weight 03/09/22 1403 245 lb (111.1 kg)  ?   Height 03/09/22 1403 6' 2.5" (1.892 m)  ?   Head Circumference --   ?   Peak Flow --   ?   Pain Score 03/09/22 1402 7  ?   Pain Loc --   ?   Pain Edu? --   ?   Excl. in [place]? --   ? ? ?Most recent vital signs: ?Vitals:  ? 03/09/22 1402 03/09/22 1605  ?BP: (!) 133/100 (!) 144/100  ?Pulse: 78 (!) 58  ?Resp: 18 18  ?Temp: 97.9 ?F (36.6 ?C)   ?SpO2: 97% 100%  ? ? ?General: Awake, NAD.  ?Skin: Warm, dry. No rashes or lesions.  ?Eyes: PERRL. Conjunctivae normal.  ?CV: Good peripheral perfusion.  ?Resp: Normal effort.  ?Abd: Soft, non-tender. No distention.  ?Neuro: At baseline. No gross neurological deficits.  ? ?Focused Exam: Scattered superficial U-shaped lacerations along the medial aspect of the left index finger.  No active bleeding or discharge.  No bony tenderness.  Patient maintains full range of motion of the digit, including flexion  extension.  No evidence of tendon injury.  No foreign bodies present. ? ?Physical Exam ? ? ? ?ED Results / Procedures / Treatments  ?Labs ?(all labs ordered are listed, but only abnormal results are displayed) ?Labs Reviewed - No data to display ? ? ?EKG ?Not applicable. ? ? ?RADIOLOGY ? ?ED Provider Interpretation: Not applicable ? ?No results found. ? ?PROCEDURES: ? ?Critical Care performed: None. ? ?Marland Kitchen.Laceration Repair ? ?Date/Time: 03/09/2022 4:04 PM ?Performed by: Teodoro Spray, PA ?Authorized by: Teodoro Spray, PA  ? ?Consent:  ?  Consent obtained:  Verbal ?  Consent given by:  Patient ?  Risks discussed:  Infection, pain, poor cosmetic result and poor wound healing ?  Alternatives discussed:  No treatment ?Universal protocol:  ?  Patient identity confirmed:  Verbally with patient ?Anesthesia:  ?  Anesthesia method:  Nerve block ?  Block location:  Left index finger ?  Block needle gauge:  27 G ?  Block anesthetic:  Lidocaine 1% w/o epi ?  Block technique:  Ring block ?  Block injection procedure:  Anatomic landmarks identified ?Laceration details:  ?  Location:  Finger ?  Finger location:  L index finger ?  Length (cm):  2 ?  Depth (mm):  2 ?Pre-procedure details:  ?  Preparation:  Patient was prepped and draped in usual sterile fashion ?Exploration:  ?  Hemostasis achieved with:  Direct pressure ?  Wound extent: no fascia violation noted, no foreign bodies/material noted, no muscle damage noted, no nerve damage noted, no tendon damage noted, no underlying fracture noted and no vascular damage noted   ?Treatment:  ?  Area cleansed with:  Soap and water ?  Amount of cleaning:  Extensive ?  Irrigation solution:  Tap water ?  Irrigation volume:  3 liters ?  Irrigation method:  Tap ?Skin repair:  ?  Repair method:  Tissue adhesive ?Approximation:  ?  Approximation:  Close ?Repair type:  ?  Repair type:  Simple ?Post-procedure details:  ?  Dressing:  Tube gauze and sterile dressing ?  Procedure  completion:  Tolerated well, no immediate complications ? ? ? ?MEDICATIONS ORDERED IN ED: ?Medications  ?lidocaine (PF) (XYLOCAINE) 1 % injection 10 mL (10 mLs Infiltration Given 03/09/22 1521)  ?Tdap (BOOSTRIX) injection 0.5 mL (0.5 mLs Intramuscular Given 03/09/22 1516)  ? ? ? ?IMPRESSION / MDM / ASSESSMENT AND PLAN / ED COURSE  ?I reviewed the triage vital signs and the nursing notes. ?             ?               ? ?Differential diagnosis includes, but is not limited to, laceration, phalangeal fracture, flexor tendon injury, extensor tendon injury. ? ?ED Course ?Patient appears well, vitals within normal limits.  NAD.  Currently not experiencing significant pain ? ?Assessment/Plan ?Patient presents with lacerations to the left index finger following accident with hedge clippers.  Wound was anesthetized utilizing ring block technique, cleansed extensively with soap and water.  Laceration does not appear to be amenable to sutures.  Laceration was repaired utilizing Dermabond solution.  Patient tolerated the procedure well with no immediate complications.  Provided patient with a tetanus shot.  Given nature of the laceration, will provide patient with short course of antibiotics.  Will discharge. ? ?Provided the patient with anticipatory guidance, return precautions, and educational material. Encouraged the patient to return to the emergency department at any time if they begin to experience any new or worsening symptoms. Patient expressed understanding and agreed with the plan.  ? ?  ? ? ?FINAL CLINICAL IMPRESSION(S) / ED DIAGNOSES  ? ?Final diagnoses:  ?Laceration of left index finger without foreign body without damage to nail, initial encounter  ? ? ? ?Rx / DC Orders  ? ?ED Discharge Orders   ? ?      Ordered  ?  cephALEXin (KEFLEX) 500 MG capsule  4 times daily       ? 03/09/22 1600  ? ?  ?  ? ?  ? ? ? ?Note:  This document was prepared using Dragon voice recognition software and may include unintentional  dictation errors. ?  ?Teodoro Spray, [place] ?03/09/22 1608 ? ?  ?Lavonia Drafts, MD ?03/09/22 1639 ? ?

## 2023-08-01 ENCOUNTER — Emergency Department: Payer: Self-pay

## 2023-08-01 ENCOUNTER — Emergency Department
Admission: EM | Admit: 2023-08-01 | Discharge: 2023-08-01 | Disposition: A | Discharge status: Home / Self Care | Payer: Self-pay | Attending: Emergency Medicine | Admitting: Emergency Medicine

## 2023-08-01 ENCOUNTER — Other Ambulatory Visit: Payer: Self-pay

## 2023-08-01 DIAGNOSIS — S92424A Nondisplaced fracture of distal phalanx of right great toe, initial encounter for closed fracture: Secondary | ICD-10-CM | POA: Insufficient documentation

## 2023-08-01 DIAGNOSIS — Y93H2 Activity, gardening and landscaping: Secondary | ICD-10-CM | POA: Insufficient documentation

## 2023-08-01 DIAGNOSIS — S99921A Unspecified injury of right foot, initial encounter: Secondary | ICD-10-CM

## 2023-08-01 DIAGNOSIS — W28XXXA Contact with powered lawn mower, initial encounter: Secondary | ICD-10-CM | POA: Insufficient documentation

## 2023-08-01 DIAGNOSIS — Z21 Asymptomatic human immunodeficiency virus [HIV] infection status: Secondary | ICD-10-CM | POA: Insufficient documentation

## 2023-08-01 DIAGNOSIS — S90931A Unspecified superficial injury of right great toe, initial encounter: Secondary | ICD-10-CM | POA: Insufficient documentation

## 2023-08-01 MED ORDER — HYDROCODONE-ACETAMINOPHEN 5-325 MG PO TABS
1.0000 | ORAL_TABLET | Freq: Once | ORAL | Status: AC
  Administered 2023-08-01: 1 via ORAL
  Filled 2023-08-01: qty 1

## 2023-08-01 MED ORDER — HYDROCODONE-ACETAMINOPHEN 5-325 MG PO TABS: 1.0000 | ORAL_TABLET | ORAL | 0 refills | Status: AC | PRN

## 2023-08-01 NOTE — ED Triage Notes (Signed)
Pt to ED for right big toe injury while mowing. No bleeding, no deformity noted.

## 2023-08-01 NOTE — ED Provider Notes (Signed)
[facility] Provider Note    Event Date/Time   First MD Initiated Contact with Patient 08/01/23 1411     (approximate)   History   Toe Injury   HPI  SMARAN BEE Sr. is a 57 y.o. male with PMH of HIV presents for evaluation of an injury to his right toe.  Patient states he was mowing the lawn earlier today and was pushing it up a steep hill when he slipped and the mower slid back over his right foot.  He was able to take his hand off the push bar stopping the blades of the mower so he does not think he cut his toe but he does have significant pain.  He has difficulty bearing weight on that foot.  He states he can stand but that he cannot walk.  He used a cane to get into the ER and then was taken back to the room in a wheelchair.     Physical Exam   Triage Vital Signs: ED Triage Vitals [08/01/23 1354]  Encounter Vitals Group     BP (!) 163/104     Systolic BP Percentile      Diastolic BP Percentile      Pulse Rate 67     Resp 18     Temp 97.8 F (36.6 C)     Temp src      SpO2 97 %     Weight 240 lb (108.9 kg)     Height 6' 2.5" (1.892 m)     Head Circumference      Peak Flow      Pain Score 7     Pain Loc      Pain Education      Exclude from Growth Chart     Most recent vital signs: Vitals:   08/01/23 1354  BP: (!) 163/104  Pulse: 67  Resp: 18  Temp: 97.8 F (36.6 C)  SpO2: 97%    General: Awake, no distress.  CV:  Good peripheral perfusion.  Resp:  Normal effort.  Abd:  No distention.  Other:  Right great toe does not have any lacerations or abrasions, mild swelling when compared to the left, there is bruising at the tip of the toe and surrounding the base of the nail, no injury to the nail, tender to palpation over the great toe, first and second metatarsal heads. Patient is able to wiggle his toes but movement of the great toe produces pain.  Dorsalis pedis pulses 2+ and regular, sensation intact across all dermatomes.   ED  Results / Procedures / Treatments   Labs (all labs ordered are listed, but only abnormal results are displayed) Labs Reviewed - No data to display   RADIOLOGY  Xrays of right great toe ordered.  PROCEDURES:  Critical Care performed: No  Procedures   MEDICATIONS ORDERED IN ED: Medications  HYDROcodone-acetaminophen (NORCO/VICODIN) 5-325 MG per tablet 1 tablet (1 tablet Oral Given 08/01/23 1454)     IMPRESSION / MDM / ASSESSMENT AND PLAN / ED COURSE  I reviewed the triage vital signs and the nursing notes.                             57 year old male presents for evaluation of right toe pain.  Patient was hypertensive in triage otherwise VSS.  Patient NAD on exam.  Differential diagnosis includes, but is not limited to, fracture, sprain, contusion, laceration.  Patient's presentation is most consistent with acute complicated illness / injury requiring diagnostic workup.  Right great toe x-rays ordered.  Patient was given Norco for pain while in the ED.  Given the above injury I suspect he has either sprained his toe or fractured it. If patient's toe is fractured, he will be placed in a postop shoe.  The same could be done if not fractured for patient comfort.  He will be given follow-up with orthopedics.  I will also give him crutches as he reports difficulty bearing weight.  He can take Tylenol and ibuprofen as needed for pain.  I advised him to ice and elevate his foot to help with the swelling which will decrease his pain.  At the time of shift change, I was still waiting for radiology read on xrays. Care of the patient will be passed off to [facility], PA-C, who will make the final diagnosis and plan.     FINAL CLINICAL IMPRESSION(S) / ED DIAGNOSES   Final diagnoses:  Injury of toe on right foot, initial encounter     Rx / DC Orders   ED Discharge Orders     None        Note:  This document was prepared using Dragon voice recognition software and may  include unintentional dictation errors.   Cameron Ali, PA-C 08/01/23 1505    Sharyn Creamer, MD 08/01/23 1537

## 2023-08-01 NOTE — Discharge Instructions (Addendum)
Do not take Tylenol with prescription medication.  Can alternate with ibuprofen if needed. Ice and elevate your foot. This will decrease your swelling and improve your pain.   Please call Dr. Binnie Rail office to schedule a follow up appointment.

## 2023-08-01 NOTE — ED Provider Notes (Signed)
-----------------------------------------   3:00 PM on 08/01/2023 -----------------------------------------  Blood pressure (!) 163/104, pulse 67, temperature 97.8 F (36.6 C), resp. rate 18, height 6' 2.5" (1.892 m), weight 108.9 kg, SpO2 97%.  Assuming care from [employer], PA-C.  In short, Alan Arnold. is a 57 y.o. male with a chief complaint of Toe Injury .  Refer to the original H&P for additional details.  Pending x-ray of right big toe.  X-ray of right big toe reveals nondisplaced distal tuft fracture. Patient will be placed in postop shoe and buddy tape of 1st and 2nd digits. He is given crutches for nonweightbearing status.  He is instructed to schedule an appointment for close follow-up with orthopedics. Pain management Hydrocodone-acetaminophen sent to pharmacy. RICE therapy instructions  provided.  ED precautions discussed.    Romeo Apple, Vaniya Augspurger A, PA-C 08/01/23 1625    Sharyn Creamer, MD 08/04/23 [phone]

## 2024-04-10 DIAGNOSIS — R0600 Dyspnea, unspecified: Secondary | ICD-10-CM | POA: Diagnosis not present

## 2024-04-10 DIAGNOSIS — R440 Auditory hallucinations: Secondary | ICD-10-CM | POA: Diagnosis not present

## 2024-04-24 DIAGNOSIS — F330 Major depressive disorder, recurrent, mild: Secondary | ICD-10-CM | POA: Diagnosis not present

## 2024-05-15 DIAGNOSIS — Z0130 Encounter for examination of blood pressure without abnormal findings: Secondary | ICD-10-CM | POA: Diagnosis not present

## 2024-05-15 DIAGNOSIS — Z1389 Encounter for screening for other disorder: Secondary | ICD-10-CM | POA: Diagnosis not present

## 2024-05-15 DIAGNOSIS — F330 Major depressive disorder, recurrent, mild: Secondary | ICD-10-CM | POA: Diagnosis not present

## 2024-05-15 DIAGNOSIS — D649 Anemia, unspecified: Secondary | ICD-10-CM | POA: Diagnosis not present

## 2024-05-15 DIAGNOSIS — R2243 Localized swelling, mass and lump, lower limb, bilateral: Secondary | ICD-10-CM | POA: Diagnosis not present

## 2024-05-22 DIAGNOSIS — Z0130 Encounter for examination of blood pressure without abnormal findings: Secondary | ICD-10-CM | POA: Diagnosis not present

## 2024-05-22 DIAGNOSIS — Z1389 Encounter for screening for other disorder: Secondary | ICD-10-CM | POA: Diagnosis not present

## 2024-05-22 DIAGNOSIS — I10 Essential (primary) hypertension: Secondary | ICD-10-CM | POA: Diagnosis not present

## 2024-05-22 DIAGNOSIS — R0600 Dyspnea, unspecified: Secondary | ICD-10-CM | POA: Diagnosis not present

## 2024-05-22 DIAGNOSIS — R440 Auditory hallucinations: Secondary | ICD-10-CM | POA: Diagnosis not present

## 2024-05-22 DIAGNOSIS — F330 Major depressive disorder, recurrent, mild: Secondary | ICD-10-CM | POA: Diagnosis not present

## 2024-06-05 DIAGNOSIS — F332 Major depressive disorder, recurrent severe without psychotic features: Secondary | ICD-10-CM | POA: Diagnosis not present

## 2024-06-05 DIAGNOSIS — Z1389 Encounter for screening for other disorder: Secondary | ICD-10-CM | POA: Diagnosis not present

## 2024-06-25 ENCOUNTER — Encounter: Payer: Self-pay | Admitting: Urology

## 2024-06-25 ENCOUNTER — Ambulatory Visit (INDEPENDENT_AMBULATORY_CARE_PROVIDER_SITE_OTHER): Payer: Self-pay | Admitting: Urology

## 2024-06-25 VITALS — BP 131/82 | HR 87 | Ht 74.5 in | Wt 251.3 lb

## 2024-06-25 DIAGNOSIS — N401 Enlarged prostate with lower urinary tract symptoms: Secondary | ICD-10-CM

## 2024-06-25 LAB — URINALYSIS, COMPLETE
Bilirubin, UA: NEGATIVE
Glucose, UA: NEGATIVE
Ketones, UA: NEGATIVE
Leukocytes,UA: NEGATIVE
Nitrite, UA: NEGATIVE
Protein,UA: NEGATIVE
RBC, UA: NEGATIVE
Specific Gravity, UA: 1.03 (ref 1.005–1.030)
Urobilinogen, Ur: 0.2 mg/dL (ref 0.2–1.0)
pH, UA: 5.5 (ref 5.0–7.5)

## 2024-06-25 LAB — MICROSCOPIC EXAMINATION

## 2024-06-25 LAB — BLADDER SCAN AMB NON-IMAGING

## 2024-06-25 NOTE — Progress Notes (Signed)
   06/25/2024 2:58 PM   Alan ONEIDA Boers Sr. 02/05/1966 [id number]  Referring provider: Osa Geralds, NP [address] [place],  [place] [address]  Chief Complaint  Patient presents with   Other    HPI: Alan Hilbun. is a 58 y.o. male referred for evaluation and management of BPH with lower urinary tract symptoms.  5-year history of bothersome lower urinary tract symptoms with his most bothersome symptoms being sensation of incomplete bladder emptying, intermittent stream, frequency and nocturia x 5 IPSS today 24/35 Symptoms are worse with his bladder emptying at nighttime and for a day after ejaculation Was seen here in 2018 for acute urinary retention with a successful voiding trial He was on tamsulosin  for ~5 years and his dose was recently doubled however he saw no significant improvement in his symptoms and he stopped taking the medication 1 month ago.  His symptoms have not worsened since stopping the med No dysuria or gross hematuria No flank, abdominal or pelvic pain PSA has been stable in the mid 1 range.   PMH: Past Medical History:  Diagnosis Date   Bronchiectasis with (acute) exacerbation (HCC)    HIV (human immunodeficiency virus infection) (HCC)     Surgical History: No past surgical history on file.  Home Medications:  Allergies as of 06/25/2024   No Known Allergies      Medication List        Accurate as of June 25, 2024  2:58 PM. If you have any questions, ask your nurse or doctor.          Genvoya 150-150-200-10 MG Tabs tablet Generic drug: elvitegravir-cobicistat-emtricitabine-tenofovir Take 1 tablet by mouth daily with breakfast.        Allergies: No Known Allergies  Family History: Family History  Problem Relation Age of Onset   Bladder Cancer Neg Hx    Kidney cancer Neg Hx    Prostate cancer Neg Hx     Social History:  reports that he has been smoking. He has never used smokeless tobacco. He reports current alcohol use. He  reports current drug use. Drugs: Cocaine and Crack cocaine.   Physical Exam: BP 131/82   Pulse 87   Ht 6' 2.5 (1.892 m)   Wt 251 lb 4.8 oz (114 kg)   BMI 31.83 kg/m   Constitutional:  Alert and oriented, No acute distress. HEENT: [place] AT Respiratory: Normal respiratory effort, no increased work of breathing. GI: Abdomen is soft, nontender, nondistended, no abdominal masses GU: Prostate 50+ g, smooth without nodules however due to body habitus only the lower one third could be palpated Psychiatric: Normal mood and affect.   Assessment & Plan:    1.  BPH with LUTS Severe lower urinary tract symptoms by IPSS PVR today 16 mL We discussed a 5-ARI medication however this will take 6 months to determine efficacy We also discussed various outlet procedures He would like to proceed with evaluation for outlet surgery and will schedule cystoscopy and TRUS for volume   Alan JAYSON Barba, MD  [facility] [address] [place], [place] [address] [phone]

## 2024-06-25 NOTE — Patient Instructions (Signed)

## 2024-08-01 ENCOUNTER — Encounter: Payer: Self-pay | Admitting: Internal Medicine

## 2024-08-01 ENCOUNTER — Ambulatory Visit (INDEPENDENT_AMBULATORY_CARE_PROVIDER_SITE_OTHER): Admitting: Internal Medicine

## 2024-08-01 VITALS — BP 118/80 | HR 58 | Temp 98.9°F | Ht 74.0 in | Wt 260.0 lb

## 2024-08-01 DIAGNOSIS — G4733 Obstructive sleep apnea (adult) (pediatric): Secondary | ICD-10-CM | POA: Diagnosis not present

## 2024-08-01 NOTE — Progress Notes (Signed)
 Name: Alan FENN Sr. MRN: [id number] DOB: 1966-07-06    CHIEF COMPLAINT:  ASSESSMENT OF SLEEP APNEA EXCESSIVE DAYTIME SLEEPINESS   HISTORY OF PRESENT ILLNESS: Patient is seen today for problems and issues with sleep related to excessive daytime sleepiness Patient  has been having sleep problems for many years Patient has been having excessive daytime sleepiness for a long time Patient has been having extreme fatigue and tiredness, lack of energy +  very Loud snoring every night + struggling breathe at night and gasps for air + morning headaches + Nonrefreshing sleep  Discussed sleep data and reviewed with patient.  Encouraged proper weight management.  Discussed driving precautions and its relationship with hypersomnolence.  Discussed operating dangerous equipment and its relationship with hypersomnolence.  Discussed sleep hygiene, and benefits of a fixed sleep waked time.  The importance of getting eight or more hours of sleep discussed with patient.  Discussed limiting the use of the computer and television before bedtime.  Decrease naps during the day, so night time sleep will become enhanced.  Limit caffeine, and sleep deprivation.  HTN, stroke, and heart failure are potential risk factors.      08/01/2024    9:00 AM  Results of the Epworth flowsheet  Sitting and reading 3  Watching TV 3  Sitting, inactive in a public place (e.g. a theatre or a meeting) 0  As a passenger in a car for an hour without a break 2  Lying down to rest in the afternoon when circumstances permit 3  Sitting and talking to someone 0  Sitting quietly after a lunch without alcohol 0  In a car, while stopped for a few minutes in traffic 2  Total score 13   Patient works as a Personnel officer Nonalcoholic Has a history of hypertension  No exacerbation at this time No evidence of heart failure at this time No evidence or signs of infection at this time No respiratory distress No  fevers, chills, nausea, vomiting, diarrhea No evidence of lower extremity edema No evidence hemoptysis    PAST MEDICAL HISTORY :   has a past medical history of Bronchiectasis with (acute) exacerbation (HCC) and HIV (human immunodeficiency virus infection) (HCC).  has no past surgical history on file. Prior to Admission medications   Medication Sig Start Date End Date Taking? Authorizing Provider  amLODipine (NORVASC) 10 MG tablet Take 10 mg by mouth daily. 07/09/24  Yes [provider]  atorvastatin (LIPITOR) 20 MG tablet Take 20 mg by mouth daily.   Yes [provider]  BIKTARVY 50-200-25 MG TABS tablet Take 1 tablet by mouth daily. 11/04/19  Yes [provider]  elvitegravir-cobicistat-emtricitabine-tenofovir (GENVOYA) 150-150-200-10 MG TABS tablet Take 1 tablet by mouth daily with breakfast.   Yes [provider]  ferrous sulfate (SM IRON) 325 (65 FE) MG tablet Take 325 mg by mouth daily with breakfast. 08/22/23  Yes [provider]  losartan-hydrochlorothiazide (HYZAAR) 50-12.5 MG tablet Take 1 tablet by mouth daily.   Yes [provider]  mirtazapine (REMERON) 15 MG tablet Take 15 mg by mouth at bedtime.   Yes [provider]  Risperidone 0.25 MG TBDP  05/13/24  Yes [provider]  selenium sulfide (SELSUN) 2.5 % lotion Apply 1 Application topically daily as needed for irritation or itching. 05/10/15  Yes [provider]   No Known Allergies  FAMILY HISTORY:  family history is not on file. SOCIAL HISTORY:  reports that he has been smoking. He has  never used smokeless tobacco. He reports current alcohol use. He reports current drug use. Drugs: Cocaine and Crack cocaine.   BP 118/80   Pulse (!) 58   Temp 98.9 F (37.2 C)   Ht 6' 2 (1.88 m)   Wt 260 lb (117.9 kg)   SpO2 96%   BMI 33.38 kg/m      Review of Systems: Gen:  Denies  fever, sweats, chills weight loss  HEENT: Denies blurred  vision, double vision, ear pain, eye pain, hearing loss, nose bleeds, sore throat Cardiac:  No dizziness, chest pain or heaviness, chest tightness,edema, No JVD Resp:   No cough, -sputum production, -shortness of breath,-wheezing, -hemoptysis,  Other:  All other systems negative   Physical Examination:   General Appearance: No distress  EYES PERRLA, EOM intact.   NECK Supple, No JVD Pulmonary: normal breath sounds, No wheezing.  CardiovascularNormal S1,S2.  No m/r/g.   Abdomen: Benign, Soft, non-tender. Neurology UE/LE 5/5 strength, no focal deficits Ext pulses intact, cap refill intact ALL OTHER ROS ARE NEGATIVE     ASSESSMENT AND PLAN SYNOPSIS  Patient with signs and symptoms of excessive daytime sleepiness with probable underlying diagnosis of obstructive sleep apnea in the setting of obesity and deconditioned state   Recommend Sleep Study for definitve diagnosis  Obesity -recommend significant weight loss -recommend changing diet  Deconditioned state -Recommend increased daily activity and exercise   MEDICATION ADJUSTMENTS/LABS AND TESTS ORDERED: Recommend home Sleep Study Recommend weight loss Avoid Allergens and Irritants Avoid secondhand smoke Avoid SICK contacts Recommend  Masking  when appropriate Recommend Keep up-to-date with vaccinations   CURRENT MEDICATIONS REVIEWED AT LENGTH WITH PATIENT TODAY   Patient  satisfied with Plan of action and management. All questions answered   Follow up 3 months   I spent a total of 60 minutes dedicated to the care of this patient on the date of this encounter to include pre-visit review of records, face-to-face time with the patient discussing conditions above, post visit ordering of testing, clinical documentation with the electronic health record, making appropriate referrals as documented, and communicating necessary information to the patient's healthcare team.     Nickolas Alm Cellar, M.D.  [facility] Medicine  Medical Director [facility] Medical Director [facility] Cardio-Pulmonary Department

## 2024-08-01 NOTE — Patient Instructions (Addendum)
   Recommend home sleep study for further assessment

## 2024-08-05 ENCOUNTER — Ambulatory Visit (INDEPENDENT_AMBULATORY_CARE_PROVIDER_SITE_OTHER): Admitting: Urology

## 2024-08-05 VITALS — BP 130/79 | HR 75 | Ht 74.0 in | Wt 260.0 lb

## 2024-08-05 DIAGNOSIS — N401 Enlarged prostate with lower urinary tract symptoms: Secondary | ICD-10-CM

## 2024-08-05 LAB — URINALYSIS, COMPLETE
Bilirubin, UA: NEGATIVE
Glucose, UA: NEGATIVE
Ketones, UA: NEGATIVE
Leukocytes,UA: NEGATIVE
Nitrite, UA: NEGATIVE
Protein,UA: NEGATIVE
RBC, UA: NEGATIVE
Specific Gravity, UA: 1.03 (ref 1.005–1.030)
Urobilinogen, Ur: 0.2 mg/dL (ref 0.2–1.0)
pH, UA: 6 (ref 5.0–7.5)

## 2024-08-05 LAB — MICROSCOPIC EXAMINATION

## 2024-08-05 NOTE — Progress Notes (Signed)
 08/05/24  Chief Complaint  Patient presents with   Cysto     HPI: 58 y.o. male with severe lower urinary tract symptoms; IPSS 20/35; history urinary retention 2018; tamsulosin  0.8 mg daily who is interested in surgical management.   Please see previous notes for details.     Blood pressure 130/79, pulse 75, height 6' 2 (1.88 m), weight 260 lb (117.9 kg). NED. A&Ox3.   No respiratory distress   Abd soft, NT, ND Normal phallus with bilateral descended testicles    Cystoscopy Procedure Note  Patient identification was confirmed, informed consent was obtained, and patient was prepped using Betadine solution.  Lidocaine  jelly was administered per urethral meatus.    Preoperative abx where received prior to procedure.     Pre-Procedure: - Inspection reveals a normal caliber urethral meatus.  Procedure: The flexible cystoscope was introduced without difficulty - No urethral strictures/lesions are present. - Coapting lateral lobes prostate - Elevated bladder neck - Bilateral ureteral orifices identified - Bladder mucosa  reveals no ulcers, tumors, or lesions - No bladder stones - No trabeculation  Retroflexion shows moderate intravesical median lobe   Post-Procedure: - Patient tolerated the procedure well   Prostate transrectal ultrasound sizing   Informed consent was obtained after discussing risks/benefits of the procedure.  A time out was performed to ensure correct patient identity.   Pre-Procedure: -Transrectal probe was placed without difficulty -Transrectal Ultrasound performed revealing a 87 gm prostate measuring 5.0 x 5.42 x 6.13 cm (length) - Intravesical median lobe noted      Assessment/ Plan:  1.  BPH with severe LUTS Desires surgical management Based on cystoscopy and prostate volume we discussed potential treatment options including minimally invasive options of Rezum, Aquablation and PAE.  HoLEP was also discussed.  He was interested in pursuing  HoLEP.  Discussed this is typically a same-day surgical procedure with an indwelling catheter x 3 days.  Potential side effects/complications were discussed including bleeding, infection, retrograde ejaculation, postop urge/urge incontinence and a low incidence of long-term incontinence He would like to proceed with HoLEP.  Orders were sent and a preop appointment will be scheduled with Dr. Francisca Glendia JAYSON Twylla, MD

## 2024-08-18 ENCOUNTER — Encounter: Payer: Self-pay | Admitting: *Deleted

## 2024-09-08 ENCOUNTER — Telehealth: Payer: Self-pay

## 2024-09-08 DIAGNOSIS — N401 Enlarged prostate with lower urinary tract symptoms: Secondary | ICD-10-CM

## 2024-09-08 NOTE — Telephone Encounter (Signed)
 Patient called in after hours nurse to schedule HOLEP- I dont have any orders for this. Can you send orders. Will call and make him an appointment to see Dr. Francisca to discuss first.

## 2024-09-12 ENCOUNTER — Other Ambulatory Visit: Payer: Self-pay

## 2024-09-12 DIAGNOSIS — N401 Enlarged prostate with lower urinary tract symptoms: Secondary | ICD-10-CM

## 2024-09-12 NOTE — Progress Notes (Signed)
 Surgical Physician Order Form [facility] [place]  * Scheduling expectation : Next Available- [place]  *Length of Case: 90 minutes  *Clearance needed: no  *Anticoagulation Instructions: N/A  *Aspirin Instructions: N/A  *Post-op visit Date/Instructions: Standard HoLEP  *Diagnosis: BPH w/BOO  *Procedure:  HOLEP (47350)   Additional orders: N/A  -Admit type: OUTpatient  -Anesthesia: General  -VTE Prophylaxis Standing Order SCD's       Other:   -Standing Lab Orders Per Anesthesia    Lab other: UA&Urine Culture  -Standing Test orders EKG/Chest x-ray per Anesthesia       Test other:   - Medications:  Ancef 2gm IV  -Other orders:  N/A

## 2024-09-15 ENCOUNTER — Telehealth: Payer: Self-pay

## 2024-09-15 NOTE — Telephone Encounter (Signed)
 Per Dr. Francisca,  Patient is to be scheduled for Holmium Laser Enucleation of the Prostate   Mr. Dowding was contacted and possible surgical dates were discussed, Monday November 24th, 2025 was agreed upon for surgery.   Patient was directed to call [phone] between 1-3pm the day before surgery to find out surgical arrival time.  Instructions were given not to eat or drink from midnight on the night before surgery and have a driver for the day of surgery. On the surgery day patient was instructed to enter through the [facility] entrance of [facility] report the Same Day Surgery desk.   Pre-Admit Testing will be in contact via phone to set up an interview with the anesthesia team to review your history and medications prior to surgery.   Reminder of this information was given to the patient.

## 2024-09-15 NOTE — Progress Notes (Signed)
   [facility] Surgical Posting Form  Surgery Date: Date: 10/13/2024  Surgeon: Dr. Redell Burnet, MD  Inpt ( No  )   Outpt (Yes)   Obs ( No  )   Diagnosis: N40.1, N13.8 Benign Prostatic Hyperplasia with Urinary Obstruction  -CPT: 47350  Surgery: Holmium Laser Enucleation of the Prostate  Stop Anticoagulations: Yes and also hold ASA  Cardiac/Medical/Pulmonary Clearance needed: no  *Orders entered into EPIC  Date: 09/15/24   *Case booked in [place]  Date: 09/15/24  *Notified pt of Surgery: Date: 09/15/24  PRE-OP UA & CX: yes, will obtain in clinic on 09/18/2024  *Placed into Prior Authorization Work [place] Date: 09/15/24  Assistant/laser/rep:No

## 2024-09-18 ENCOUNTER — Other Ambulatory Visit
Admission: RE | Admit: 2024-09-18 | Discharge: 2024-09-18 | Disposition: A | Discharge status: Home / Self Care | Attending: Urology | Admitting: Urology

## 2024-09-18 ENCOUNTER — Ambulatory Visit: Admitting: Urology

## 2024-09-18 VITALS — BP 133/88 | HR 59 | Wt 253.0 lb

## 2024-09-18 DIAGNOSIS — N138 Other obstructive and reflux uropathy: Secondary | ICD-10-CM

## 2024-09-18 DIAGNOSIS — Z125 Encounter for screening for malignant neoplasm of prostate: Secondary | ICD-10-CM

## 2024-09-18 DIAGNOSIS — R3912 Poor urinary stream: Secondary | ICD-10-CM | POA: Diagnosis not present

## 2024-09-18 DIAGNOSIS — N401 Enlarged prostate with lower urinary tract symptoms: Secondary | ICD-10-CM | POA: Insufficient documentation

## 2024-09-18 DIAGNOSIS — R338 Other retention of urine: Secondary | ICD-10-CM

## 2024-09-18 DIAGNOSIS — R32 Unspecified urinary incontinence: Secondary | ICD-10-CM

## 2024-09-18 DIAGNOSIS — R3916 Straining to void: Secondary | ICD-10-CM | POA: Diagnosis not present

## 2024-09-18 LAB — URINALYSIS, COMPLETE (UACMP) WITH MICROSCOPIC
Bilirubin Urine: NEGATIVE
Glucose, UA: NEGATIVE mg/dL
Hgb urine dipstick: NEGATIVE
Ketones, ur: NEGATIVE mg/dL
Leukocytes,Ua: NEGATIVE
Nitrite: NEGATIVE
Protein, ur: NEGATIVE mg/dL
Specific Gravity, Urine: >1.03 — ABNORMAL HIGH (ref 1.005–1.030)
pH: 5.5 (ref 5.0–8.0)

## 2024-09-18 NOTE — Patient Instructions (Signed)

## 2024-09-18 NOTE — Progress Notes (Signed)
   09/18/2024 11:12 AM   Alan ONEIDA Boers Sr. [birth year] [id number]  Reason for visit: BPH, discuss HOLEP, PSA screening  History: Previously followed by Dr. Twylla, referred to me today to discuss HOLEP Long history of bothersome urinary symptoms at least 5 years, IPSS score 28, history of Foley dependent urinary retention 2018, previously managed on Flomax  Recent cystoscopy showed obstructive lateral lobes of the prostate, large median lobe, prostate volume on ultrasound 87g PSAs have reportedly been normal ranging from 1-2  Physical Exam: BP 133/88 (BP Location: Left Arm, Patient Position: Sitting, Cuff Size: Normal)   Pulse (!) 59   Wt 253 lb (114.8 kg)   SpO2 99%   BMI 32.48 kg/m   Today: Continues to have bothersome obstructive urinary symptoms, weak stream, straining, leakage overnight, sensation of incomplete emptying  Plan:   BPH with obstruction: We discussed other alternatives like addition of finasteride, he prefers most definitive treatment with HOLEP.  With his history of retention and symptoms refractory to medical management very reasonable. We discussed the risks and benefits of HoLEP at length.  The procedure requires general anesthesia and takes 1 to 2 hours, and a holmium laser is used to enucleate the prostate and push this tissue into the bladder.  A morcellator is then used to remove this tissue, which is sent for pathology.  The vast majority(>95%) of patients are able to discharge the same day with a catheter in place for 2 to 3 days, and will follow-up in clinic for a voiding trial.  We specifically discussed the risks of bleeding, infection, retrograde ejaculation, temporary urgency and urge incontinence, very low risk of long-term incontinence, urethral stricture/bladder neck contracture, pathologic evaluation of prostate tissue and possible detection of prostate cancer or other malignancy, and possible need for additional procedures. Schedule  HoLEP(87g)   Alan JAYSON Burnet, MD  [facility] [address], [address] [place], [place] [address] [phone]

## 2024-09-19 LAB — URINE CULTURE: Culture: NO GROWTH

## 2024-10-02 ENCOUNTER — Other Ambulatory Visit: Payer: Self-pay

## 2024-10-02 ENCOUNTER — Encounter
Admission: RE | Admit: 2024-10-02 | Discharge: 2024-10-02 | Disposition: A | Discharge status: Home / Self Care | Source: Ambulatory Visit | Attending: Urology | Admitting: Urology

## 2024-10-02 VITALS — Ht 74.5 in | Wt 253.0 lb

## 2024-10-02 DIAGNOSIS — I10 Essential (primary) hypertension: Secondary | ICD-10-CM

## 2024-10-02 DIAGNOSIS — Z01810 Encounter for preprocedural cardiovascular examination: Secondary | ICD-10-CM

## 2024-10-02 HISTORY — DX: Benign prostatic hyperplasia with lower urinary tract symptoms: N40.1

## 2024-10-02 HISTORY — DX: Anxiety disorder, unspecified: F41.9

## 2024-10-02 HISTORY — DX: Major depressive disorder, single episode, unspecified: F32.9

## 2024-10-02 HISTORY — DX: Retention of urine, unspecified: R33.9

## 2024-10-02 HISTORY — DX: Sleep disorder, unspecified: G47.9

## 2024-10-02 HISTORY — DX: Obstructive sleep apnea (adult) (pediatric): G47.33

## 2024-10-02 NOTE — Patient Instructions (Addendum)
 Your procedure is scheduled on: Monday 10/13/24 Report to the Registration Desk on the 1st floor of the [facility]. To find out your arrival time, please call [phone] between 1PM - 3PM on: Friday 10/10/24 If your arrival time is 6:00 am, do not arrive before that time as the [facility] entrance doors do not open until 6:00 am.  REMEMBER: Instructions that are not followed completely may result in serious medical risk, up to and including death; or upon the discretion of your surgeon and anesthesiologist your surgery may need to be rescheduled.  Do not eat food or drink fluids after midnight the night before surgery.  No gum chewing or hard candies.   One week prior to surgery: Stop Anti-inflammatories (NSAIDS) such as Advil, Aleve, Ibuprofen, Motrin, Naproxen, Naprosyn and Aspirin based products such as Excedrin, Goody's Powder, BC Powder. Stop ANY OVER THE COUNTER supplements until after surgery.  You may however, continue to take Tylenol  if needed for pain up until the day of surgery.  Continue taking all of your other prescription medications up until the day of surgery.  ON THE DAY OF SURGERY ONLY TAKE THESE MEDICATIONS WITH SIPS OF WATER:  None   No Alcohol for 24 hours before or after surgery.  No Smoking including e-cigarettes for 24 hours before surgery.  No chewable tobacco products for at least 6 hours before surgery.  No nicotine patches on the day of surgery.  Do not use any recreational drugs for at least a week (preferably 2 weeks) before your surgery.  Please be advised that the combination of cocaine and anesthesia may have negative outcomes, up to and including death. If you test positive for cocaine, your surgery will be cancelled.  On the morning of surgery brush your teeth with toothpaste and water, you may rinse your mouth with mouthwash if you wish. Do not swallow any toothpaste or mouthwash.  Take a fresh shower the morning of your procedure.    Do not wear jewelry, make-up, hairpins, clips or nail polish.  For welded (permanent) jewelry: bracelets, anklets, waist bands, etc.  Please have this removed prior to surgery.  If it is not removed, there is a chance that hospital personnel will need to cut it off on the day of surgery.  Do not wear lotions, powders, or perfumes.   Do not shave body hair from the neck down 48 hours before surgery.  Contact lenses, hearing aids and dentures may not be worn into surgery.  Do not bring valuables to the hospital. [facility] is not responsible for any missing/lost belongings or valuables.   Notify your doctor if there is any change in your medical condition (cold, fever, infection).  Wear comfortable clothing (specific to your surgery type) to the hospital.  After surgery, you can help prevent lung complications by doing breathing exercises.  Take deep breaths and cough every 1-2 hours. Your doctor may order a device called an Incentive Spirometer to help you take deep breaths. When coughing or sneezing, hold a pillow firmly against your incision with both hands. This is called "splinting." Doing this helps protect your incision. It also decreases belly discomfort.  If you are being admitted to the hospital overnight, leave your suitcase in the car. After surgery it may be brought to your room.  In case of increased patient census, it may be necessary for you, the patient, to continue your postoperative care in the Same Day Surgery department.  If you are being discharged the  day of surgery, you will not be allowed to drive home. You will need a responsible individual to drive you home and stay with you for 24 hours after surgery.   If you are taking public transportation, you will need to have a responsible individual with you.  Please call the Pre-admissions Testing Dept. at [phone] if you have any questions about these instructions.  Surgery Visitation Policy:  Patients  having surgery or a procedure may have two visitors.  Children under the age of 62 must have an adult with them who is not the patient.  Inpatient Visitation:    Visiting hours are 7 a.m. to 8 p.m. Up to four visitors are allowed at one time in a patient room. The visitors may rotate out with other people during the day.  One visitor age 52 or older may stay with the patient overnight and must be in the room by 8 p.m.   Merchandiser, Retail to address health-related social needs:  [email or web address]

## 2024-10-06 ENCOUNTER — Encounter
Admission: RE | Admit: 2024-10-06 | Discharge: 2024-10-06 | Disposition: A | Discharge status: Home / Self Care | Source: Ambulatory Visit | Attending: Urology | Admitting: Urology

## 2024-10-06 DIAGNOSIS — I10 Essential (primary) hypertension: Secondary | ICD-10-CM | POA: Diagnosis not present

## 2024-10-06 DIAGNOSIS — Z01812 Encounter for preprocedural laboratory examination: Secondary | ICD-10-CM | POA: Diagnosis present

## 2024-10-06 DIAGNOSIS — Z01810 Encounter for preprocedural cardiovascular examination: Secondary | ICD-10-CM | POA: Diagnosis present

## 2024-10-06 DIAGNOSIS — Z01818 Encounter for other preprocedural examination: Secondary | ICD-10-CM | POA: Insufficient documentation

## 2024-10-06 LAB — CBC
HCT: 40.5 % (ref 39.0–52.0)
Hemoglobin: 12.9 g/dL — ABNORMAL LOW (ref 13.0–17.0)
MCH: 23 pg — ABNORMAL LOW (ref 26.0–34.0)
MCHC: 31.9 g/dL (ref 30.0–36.0)
MCV: 72.2 fL — ABNORMAL LOW (ref 80.0–100.0)
Platelets: 162 K/uL (ref 150–400)
RBC: 5.61 MIL/uL (ref 4.22–5.81)
RDW: 15.5 % (ref 11.5–15.5)
WBC: 4.9 K/uL (ref 4.0–10.5)
nRBC: 0 % (ref 0.0–0.2)

## 2024-10-06 LAB — BASIC METABOLIC PANEL WITH GFR
Anion gap: 8 (ref 5–15)
BUN: 15 mg/dL (ref 6–20)
CO2: 28 mmol/L (ref 22–32)
Calcium: 9.1 mg/dL (ref 8.9–10.3)
Chloride: 107 mmol/L (ref 98–111)
Creatinine, Ser: 1.38 mg/dL — ABNORMAL HIGH (ref 0.61–1.24)
GFR, Estimated: 59 mL/min — ABNORMAL LOW (ref >60)
Glucose, Bld: 100 mg/dL — ABNORMAL HIGH (ref 70–99)
Potassium: 3.7 mmol/L (ref 3.5–5.1)
Sodium: 142 mmol/L (ref 135–145)

## 2024-10-12 MED ORDER — CHLORHEXIDINE GLUCONATE 0.12 % MT SOLN
15.0000 mL | Freq: Once | OROMUCOSAL | Status: AC
  Administered 2024-10-13: 15 mL via OROMUCOSAL

## 2024-10-12 MED ORDER — CEFAZOLIN SODIUM-DEXTROSE 2-4 GM/100ML-% IV SOLN
2.0000 g | INTRAVENOUS | Status: AC
  Administered 2024-10-13: 2 g via INTRAVENOUS

## 2024-10-12 MED ORDER — ORAL CARE MOUTH RINSE: 15.0000 mL | Freq: Once | OROMUCOSAL | Status: AC

## 2024-10-12 MED ORDER — LACTATED RINGERS IV SOLN: INTRAVENOUS | Status: DC

## 2024-10-13 ENCOUNTER — Ambulatory Visit: Admitting: Certified Registered Nurse Anesthetist

## 2024-10-13 ENCOUNTER — Encounter
Admission: RE | Disposition: A | Discharge status: Home / Self Care | Payer: Self-pay | Source: Home / Self Care | Attending: Urology

## 2024-10-13 ENCOUNTER — Other Ambulatory Visit: Payer: Self-pay

## 2024-10-13 ENCOUNTER — Encounter: Payer: Self-pay | Admitting: Urology

## 2024-10-13 ENCOUNTER — Ambulatory Visit
Admission: RE | Admit: 2024-10-13 | Discharge: 2024-10-13 | Disposition: A | Discharge status: Home / Self Care | Attending: Urology | Admitting: Urology

## 2024-10-13 DIAGNOSIS — R3912 Poor urinary stream: Secondary | ICD-10-CM | POA: Insufficient documentation

## 2024-10-13 DIAGNOSIS — R338 Other retention of urine: Secondary | ICD-10-CM | POA: Insufficient documentation

## 2024-10-13 DIAGNOSIS — N401 Enlarged prostate with lower urinary tract symptoms: Secondary | ICD-10-CM | POA: Diagnosis present

## 2024-10-13 DIAGNOSIS — F32A Depression, unspecified: Secondary | ICD-10-CM | POA: Insufficient documentation

## 2024-10-13 DIAGNOSIS — Z21 Asymptomatic human immunodeficiency virus [HIV] infection status: Secondary | ICD-10-CM | POA: Insufficient documentation

## 2024-10-13 DIAGNOSIS — G4733 Obstructive sleep apnea (adult) (pediatric): Secondary | ICD-10-CM | POA: Diagnosis not present

## 2024-10-13 DIAGNOSIS — N138 Other obstructive and reflux uropathy: Secondary | ICD-10-CM | POA: Diagnosis present

## 2024-10-13 DIAGNOSIS — Z87891 Personal history of nicotine dependence: Secondary | ICD-10-CM | POA: Insufficient documentation

## 2024-10-13 DIAGNOSIS — F419 Anxiety disorder, unspecified: Secondary | ICD-10-CM | POA: Diagnosis not present

## 2024-10-13 HISTORY — PX: HOLEP-LASER ENUCLEATION OF THE PROSTATE WITH MORCELLATION: SHX6641

## 2024-10-13 SURGERY — ENUCLEATION, PROSTATE, USING LASER, WITH MORCELLATION
Anesthesia: General | Site: Prostate

## 2024-10-13 MED ORDER — CHLORHEXIDINE GLUCONATE 0.12 % MT SOLN
OROMUCOSAL | Status: AC
  Filled 2024-10-13: qty 15

## 2024-10-13 MED ORDER — EPHEDRINE SULFATE-NACL 50-0.9 MG/10ML-% IV SOSY
PREFILLED_SYRINGE | INTRAVENOUS | Status: DC | PRN
Start: 2024-10-13 — End: 2024-10-13
  Administered 2024-10-13: 7.5 mg via INTRAVENOUS

## 2024-10-13 MED ORDER — STERILE WATER FOR IRRIGATION IR SOLN
Status: DC | PRN
  Administered 2024-10-13: 1000 mL

## 2024-10-13 MED ORDER — OXYCODONE HCL 5 MG/5ML PO SOLN: 5.0000 mg | Freq: Once | ORAL | Status: AC | PRN

## 2024-10-13 MED ORDER — DEXAMETHASONE SOD PHOSPHATE PF 10 MG/ML IJ SOLN
INTRAMUSCULAR | Status: DC | PRN
  Administered 2024-10-13: 5 mg via INTRAVENOUS

## 2024-10-13 MED ORDER — CEFAZOLIN SODIUM-DEXTROSE 2-4 GM/100ML-% IV SOLN
INTRAVENOUS | Status: AC
  Filled 2024-10-13: qty 100

## 2024-10-13 MED ORDER — MIDAZOLAM HCL 2 MG/2ML IJ SOLN
INTRAMUSCULAR | Status: AC
  Filled 2024-10-13: qty 2

## 2024-10-13 MED ORDER — MIDAZOLAM HCL (PF) 2 MG/2ML IJ SOLN
INTRAMUSCULAR | Status: DC | PRN
  Administered 2024-10-13: 2 mg via INTRAVENOUS

## 2024-10-13 MED ORDER — PROPOFOL 10 MG/ML IV BOLUS
INTRAVENOUS | Status: DC | PRN
Start: 2024-10-13 — End: 2024-10-13
  Administered 2024-10-13: 20 mg via INTRAVENOUS
  Administered 2024-10-13: 200 mg via INTRAVENOUS
  Administered 2024-10-13: 20 mg via INTRAVENOUS

## 2024-10-13 MED ORDER — LIDOCAINE HCL (CARDIAC) PF 100 MG/5ML IV SOSY
PREFILLED_SYRINGE | INTRAVENOUS | Status: DC | PRN
  Administered 2024-10-13: 100 mg via INTRAVENOUS

## 2024-10-13 MED ORDER — ACETAMINOPHEN 10 MG/ML IV SOLN
INTRAVENOUS | Status: AC
  Filled 2024-10-13: qty 100

## 2024-10-13 MED ORDER — ACETAMINOPHEN 10 MG/ML IV SOLN
INTRAVENOUS | Status: DC | PRN
  Administered 2024-10-13: 1000 mg via INTRAVENOUS

## 2024-10-13 MED ORDER — OXYCODONE HCL 5 MG PO TABS
5.0000 mg | ORAL_TABLET | Freq: Once | ORAL | Status: AC | PRN
  Administered 2024-10-13: 5 mg via ORAL

## 2024-10-13 MED ORDER — FENTANYL CITRATE (PF) 100 MCG/2ML IJ SOLN
INTRAMUSCULAR | Status: DC | PRN
  Administered 2024-10-13 (×2): 50 ug via INTRAVENOUS

## 2024-10-13 MED ORDER — FENTANYL CITRATE (PF) 100 MCG/2ML IJ SOLN: 25.0000 ug | INTRAMUSCULAR | Status: DC | PRN

## 2024-10-13 MED ORDER — TRAMADOL HCL 50 MG PO TABS: 25.0000 mg | ORAL_TABLET | Freq: Four times a day (QID) | ORAL | 0 refills | Status: AC | PRN

## 2024-10-13 MED ORDER — SODIUM CHLORIDE 0.9 % IR SOLN
Status: DC | PRN
Start: 2024-10-13 — End: 2024-10-13
  Administered 2024-10-13: 6000 mL via INTRAVESICAL
  Administered 2024-10-13: 15000 mL via INTRAVESICAL
  Administered 2024-10-13: 12000 mL via INTRAVESICAL

## 2024-10-13 MED ORDER — ROCURONIUM BROMIDE 10 MG/ML (PF) SYRINGE
PREFILLED_SYRINGE | INTRAVENOUS | Status: AC
  Filled 2024-10-13: qty 10

## 2024-10-13 MED ORDER — LIDOCAINE HCL (PF) 2 % IJ SOLN
INTRAMUSCULAR | Status: AC
  Filled 2024-10-13: qty 5

## 2024-10-13 MED ORDER — ONDANSETRON HCL 4 MG/2ML IJ SOLN
INTRAMUSCULAR | Status: DC | PRN
  Administered 2024-10-13: 4 mg via INTRAVENOUS

## 2024-10-13 MED ORDER — ONDANSETRON HCL 4 MG/2ML IJ SOLN
INTRAMUSCULAR | Status: AC
  Filled 2024-10-13: qty 2

## 2024-10-13 MED ORDER — ROCURONIUM BROMIDE 100 MG/10ML IV SOLN
INTRAVENOUS | Status: DC | PRN
Start: 2024-10-13 — End: 2024-10-13
  Administered 2024-10-13: 50 mg via INTRAVENOUS
  Administered 2024-10-13: 10 mg via INTRAVENOUS

## 2024-10-13 MED ORDER — PHENYLEPHRINE 80 MCG/ML (10ML) SYRINGE FOR IV PUSH (FOR BLOOD PRESSURE SUPPORT)
PREFILLED_SYRINGE | INTRAVENOUS | Status: DC | PRN
  Administered 2024-10-13 (×2): 160 ug via INTRAVENOUS

## 2024-10-13 MED ORDER — SUGAMMADEX SODIUM 500 MG/5ML IV SOLN
INTRAVENOUS | Status: DC | PRN
  Administered 2024-10-13: 50 mg via INTRAVENOUS
  Administered 2024-10-13: 200 mg via INTRAVENOUS
  Administered 2024-10-13: 50 mg via INTRAVENOUS

## 2024-10-13 MED ORDER — OXYCODONE HCL 5 MG PO TABS
ORAL_TABLET | ORAL | Status: AC
  Filled 2024-10-13: qty 1

## 2024-10-13 MED ORDER — FENTANYL CITRATE (PF) 100 MCG/2ML IJ SOLN
INTRAMUSCULAR | Status: AC
  Filled 2024-10-13: qty 2

## 2024-10-13 SURGICAL SUPPLY — 24 items
ADAPTER IRRIG TUBE 2 SPIKE SOL (ADAPTER) ×2 IMPLANT
BAG URO DRAIN 4000ML (MISCELLANEOUS) ×1 IMPLANT
CATH URETL OPEN END 4X70 (CATHETERS) ×1 IMPLANT
CATH URTH STD 24FR FL 3W 2 (CATHETERS) ×1 IMPLANT
CONTAINER COLLECT MORCELLATR (MISCELLANEOUS) ×1 IMPLANT
DRAPE UTILITY 15X26 TOWEL STRL (DRAPES) IMPLANT
FIBER LASER MOSES 550 DFL (Laser) ×1 IMPLANT
FILTER OVERFLOW MORCELLATOR (FILTER) ×1 IMPLANT
GLOVE BIOGEL PI IND STRL 7.5 (GLOVE) ×2 IMPLANT
GOWN STRL REUS W/ TWL LRG LVL3 (GOWN DISPOSABLE) ×1 IMPLANT
GOWN STRL REUS W/ TWL XL LVL3 (GOWN DISPOSABLE) ×1 IMPLANT
HOLDER FOLEY CATH W/STRAP (MISCELLANEOUS) ×1 IMPLANT
KIT TURNOVER CYSTO (KITS) ×1 IMPLANT
MEMBRANE SLNG YLW 17 FOR INST (MISCELLANEOUS) ×1 IMPLANT
MORCELLATOR ROTATION 4.75 335 (MISCELLANEOUS) ×1 IMPLANT
PACK CYSTO AR (MISCELLANEOUS) ×1 IMPLANT
SET CYSTO IRRIGATION (SET/KITS/TRAYS/PACK) ×1 IMPLANT
SET IRRIG Y-TYPE CYSTO (SET/KITS/TRAYS/PACK) ×1 IMPLANT
SLEEVE PROTECTION STRL DISP (MISCELLANEOUS) ×2 IMPLANT
SOL .9 NS 3000ML IRR UROMATIC (IV SOLUTION) ×5 IMPLANT
SOLN STERILE WATER BTL 1000 ML (IV SOLUTION) ×1 IMPLANT
SURGILUBE 2OZ TUBE FLIPTOP (MISCELLANEOUS) ×1 IMPLANT
SYRINGE TOOMEY IRRIG 70ML (MISCELLANEOUS) ×1 IMPLANT
TUBE PUMP MORCELLATOR PIRANHA (TUBING) ×1 IMPLANT

## 2024-10-13 NOTE — Anesthesia Preprocedure Evaluation (Signed)
 Anesthesia Evaluation  Patient identified by MRN, date of birth, ID band Patient awake    Reviewed: Allergy & Precautions, NPO status , Patient's Chart, lab work & pertinent test results  History of Anesthesia Complications Negative for: history of anesthetic complications  Airway Mallampati: III  TM Distance: >3 FB Neck ROM: full    Dental no notable dental hx.    Pulmonary sleep apnea , former smoker   Pulmonary exam normal        Cardiovascular hypertension, On Medications Normal cardiovascular exam     Neuro/Psych  PSYCHIATRIC DISORDERS Anxiety Depression    negative neurological ROS     GI/Hepatic negative GI ROS, Neg liver ROS,,,  Endo/Other  negative endocrine ROS    Renal/GU      Musculoskeletal   Abdominal   Peds  Hematology  (+) HIV  Anesthesia Other Findings Past Medical History: No date: Anxiety No date: Benign prostatic hyperplasia with lower urinary tract  symptoms, symptom details unspecified No date: Bronchiectasis with (acute) exacerbation (HCC) No date: HIV (human immunodeficiency virus infection) (HCC) No date: Major depressive disorder No date: OSA (obstructive sleep apnea) No date: Sleep disturbances No date: Urinary retention  Past Surgical History: No date: COLONOSCOPY  BMI    Body Mass Index: 32.05 kg/m      Reproductive/Obstetrics negative OB ROS                              Anesthesia Physical Anesthesia Plan  ASA: 3  Anesthesia Plan: General ETT   Post-op Pain Management: Toradol IV (intra-op)* and Ofirmev  IV (intra-op)*   Induction: Intravenous  PONV Risk Score and Plan: 2 and Ondansetron , Dexamethasone , Midazolam  and Treatment may vary due to age or medical condition  Airway Management Planned: Oral ETT  Additional Equipment:   Intra-op Plan:   Post-operative Plan: Extubation in OR  Informed Consent: I have reviewed the patients  History and Physical, chart, labs and discussed the procedure including the risks, benefits and alternatives for the proposed anesthesia with the patient or authorized representative who has indicated his/her understanding and acceptance.     Dental Advisory Given  Plan Discussed with: Anesthesiologist, CRNA and Surgeon  Anesthesia Plan Comments: (Patient consented for risks of anesthesia including but not limited to:  - adverse reactions to medications - damage to eyes, teeth, lips or other oral mucosa - nerve damage due to positioning  - sore throat or hoarseness - Damage to heart, brain, nerves, lungs, other parts of body or loss of life  Patient voiced understanding and assent.)        Anesthesia Quick Evaluation

## 2024-10-13 NOTE — Op Note (Signed)
 Date of procedure: 10/13/24  Preoperative diagnosis:  BPH with obstruction  Postoperative diagnosis:  Same  Procedure: HoLEP (Holmium Laser Enucleation of the Prostate)  Surgeon: Redell Burnet, MD  Anesthesia: General  Complications: None  Intraoperative findings:  Large prostate with massive median lobe, moderate bladder trabeculations, no suspicious lesions Uncomplicated HOLEP, excellent hemostasis, ureteral orifices and verumontanum intact at conclusion of case  EBL: Minimal  Specimens: Prostate chips  Enucleation time: 16 minutes  Morcellation time: 20 minutes  Intra-op weight: 74g  Drains: 24 French three-way, 60 cc in balloon  Indication: Alan ONEIDA Boers Sr. is a 58 y.o. patient with BPH and obstruction despite Flomax , history of retention, who opted for HOLEP.  After reviewing the management options for treatment, they elected to proceed with the above surgical procedure(s). We have discussed the potential benefits and risks of the procedure, side effects of the proposed treatment, the likelihood of the patient achieving the goals of the procedure, and any potential problems that might occur during the procedure or recuperation.  We specifically discussed the risks of bleeding, infection, hematuria and clot retention, need for additional procedures, possible overnight hospital stay, temporary urgency and incontinence, rare long-term incontinence, and retrograde ejaculation.  Informed consent has been obtained.   Description of procedure:  The patient was taken to the operating room and general anesthesia was induced.  The patient was placed in the dorsal lithotomy position, prepped and draped in the usual sterile fashion, and preoperative antibiotics were administered.  SCDs were placed for DVT prophylaxis.  A preoperative time-out was performed.   Alan Arnold sounds were used to gently dilated the urethra up to 62F. The 102 French continuous flow resectoscope was inserted  into the urethra using the visual obturator  The prostate was large with obstructing lateral lobes and a massive median lobe with intravesical protrusion. The bladder was thoroughly inspected and notable for moderate trabeculations but no suspicious lesions.  I was unable to visualize the ureteral orifices secondary to the massive intravesical median lobe.  The laser was set to 2 J and 60 Hz and early apical release was performed by making a circumferential mucosal incision proximal to the sphincter.  A lambda incision was then made proximal to the verumontanum.  The prostate was enucleated en bloc circumferentially into the bladder.  The capsule was examined and laser was used for meticulous hemostasis.    The 69 French resectoscope was then switched out for the 26 French nephroscope and prostate tissue was morcellated(Piranha) and the tissue sent to pathology.  A 24 French three-way catheter was inserted easily with the aid of a catheter guide, and 60 cc were placed in the balloon.  Urine was clear.  The catheter irrigated easily with a Toomey syringe.  CBI was initiated.   The patient tolerated the procedure well without any immediate complications and was extubated and transferred to the recovery room in stable condition.  Urine was clear on fast CBI.  Disposition: Stable to PACU  Plan: Wean CBI in PACU, anticipate discharge home today with Foley removal in clinic in 2-3 days  Redell Burnet, MD 10/13/2024

## 2024-10-13 NOTE — Anesthesia Procedure Notes (Signed)
 Procedure Name: Intubation Date/Time: 10/13/2024 9:26 AM  Performed by: Claudene Cornet, CRNAPre-anesthesia Checklist: Patient identified, Emergency Drugs available, Suction available and Patient being monitored Patient Re-evaluated:Patient Re-evaluated prior to induction Oxygen Delivery Method: Circle system utilized Preoxygenation: Pre-oxygenation with 100% oxygen Induction Type: IV induction and Cricoid Pressure applied Laryngoscope Size: Mac, McGrath and 4 Grade View: Grade I Tube type: Oral Tube size: 7.5 mm Number of attempts: 1 Airway Equipment and Method: Stylet and Video-laryngoscopy Placement Confirmation: ETT inserted through vocal cords under direct vision, positive ETCO2 and breath sounds checked- equal and bilateral Secured at: 23 cm Tube secured with: Tape Dental Injury: Teeth and Oropharynx as per pre-operative assessment

## 2024-10-13 NOTE — H&P (Signed)
   10/13/24 9:00 AM   Alan ONEIDA Boers Sr. May 25, 1966 [id number]  CC: BPH with obstruction  HPI: 58 year old male with at least a few years of worsening urinary symptoms and weak stream, history of retention 2018, refractory to medical management and opted for definitive management with HOLEP   PMH: Past Medical History:  Diagnosis Date   Anxiety    Benign prostatic hyperplasia with lower urinary tract symptoms, symptom details unspecified    Bronchiectasis with (acute) exacerbation (HCC)    HIV (human immunodeficiency virus infection) (HCC)    Major depressive disorder    OSA (obstructive sleep apnea)    Sleep disturbances    Urinary retention     Surgical History: Past Surgical History:  Procedure Laterality Date   COLONOSCOPY       Family History: Family History  Problem Relation Age of Onset   Bladder Cancer Neg Hx    Kidney cancer Neg Hx    Prostate cancer Neg Hx     Social History:  reports that he quit smoking about 38 years ago. His smoking use included cigarettes. He started smoking about 5 years ago. He has never used smokeless tobacco. He reports that he does not currently use alcohol. He reports current drug use. Drugs: Cocaine and Crack cocaine.  Physical Exam: BP (!) 143/99   Pulse (!) 56   Temp (!) 97.1 F (36.2 C) (Temporal)   Resp 17   Ht 6' 2.5 (1.892 m)   Wt 114.8 kg   SpO2 98%   BMI 32.05 kg/m    Constitutional:  Alert and oriented, No acute distress. Cardiovascular: Regular rate and rhythm Respiratory: Clear to auscultation bilaterally GI: Abdomen is soft, nontender, nondistended, no abdominal masses   Laboratory Data: Culture no growth   Assessment & Plan:   58 year old male with BPH and obstructive urinary symptoms, refractory to medications, history of prior urinary retention opted for HOLEP.  PSA has been normal.  We discussed the risks and benefits of HoLEP at length.  The procedure requires general anesthesia and takes 1 to  2 hours, and a holmium laser is used to enucleate the prostate and push this tissue into the bladder.  A morcellator is then used to remove this tissue, which is sent for pathology.  The vast majority(>95%) of patients are able to discharge the same day with a catheter in place for 2 to 3 days, and will follow-up in clinic for a voiding trial.  We specifically discussed the risks of bleeding, infection, retrograde ejaculation, temporary urgency and urge incontinence, very low risk of long-term incontinence, urethral stricture/bladder neck contracture, pathologic evaluation of prostate tissue and possible detection of prostate cancer or other malignancy, and possible need for additional procedures.  HOLEP today    Alan Burnet, MD 10/13/2024  [facility] [address] [place], [place] [address] [phone]

## 2024-10-13 NOTE — Anesthesia Postprocedure Evaluation (Signed)
 Anesthesia Post Note  Patient: Alan WAINWRIGHT Sr.  Procedure(s) Performed: ENUCLEATION, PROSTATE, USING LASER, WITH MORCELLATION (Prostate)  Patient location during evaluation: PACU Anesthesia Type: General Level of consciousness: awake and alert Pain management: pain level controlled Vital Signs Assessment: post-procedure vital signs reviewed and stable Respiratory status: spontaneous breathing, nonlabored ventilation, respiratory function stable and patient connected to nasal cannula oxygen Cardiovascular status: blood pressure returned to baseline and stable Postop Assessment: no apparent nausea or vomiting Anesthetic complications: no   No notable events documented.   Last Vitals:  Vitals:   10/13/24 1100 10/13/24 1121  BP: (!) 140/99 (!) 149/99  Pulse: 79 71  Resp: 16 18  Temp: (!) 36.2 C (!) 36.2 C  SpO2: 100% 100%    Last Pain:  Vitals:   10/13/24 1121  TempSrc: Temporal  PainSc: 4                  Lendia LITTIE Mae

## 2024-10-13 NOTE — Progress Notes (Signed)
 Patient cbi weaned at 1100 per care order. Urine light pink and clear prior to weaning. Switched patient to leg bag per patient request.

## 2024-10-13 NOTE — Transfer of Care (Signed)
 Immediate Anesthesia Transfer of Care Note  Patient: Alan JEFFERYS Sr.  Procedure(s) Performed: ENUCLEATION, PROSTATE, USING LASER, WITH MORCELLATION (Prostate)  Patient Location: PACU  Anesthesia Type:General  Level of Consciousness: awake, drowsy, and patient cooperative  Airway & Oxygen Therapy: Patient Spontanous Breathing and Patient connected to face mask oxygen  Post-op Assessment: Report given to RN, Post -op Vital signs reviewed and stable, and Patient moving all extremities  Post vital signs: Reviewed and stable  Last Vitals:  Vitals Value Taken Time  BP    Temp    Pulse 83 10/13/24 10:43  Resp 12 10/13/24 10:43  SpO2 100 % 10/13/24 10:43  Vitals shown include unfiled device data.  Last Pain:  Vitals:   10/13/24 0750  TempSrc: Temporal  PainSc: 0-No pain         Complications: No notable events documented.  See flowsheet for vital signs.

## 2024-10-14 ENCOUNTER — Ambulatory Visit: Payer: Self-pay | Admitting: Urology

## 2024-10-14 ENCOUNTER — Encounter: Payer: Self-pay | Admitting: Urology

## 2024-10-14 LAB — SURGICAL PATHOLOGY

## 2024-10-15 ENCOUNTER — Ambulatory Visit (INDEPENDENT_AMBULATORY_CARE_PROVIDER_SITE_OTHER): Admitting: Physician Assistant

## 2024-10-15 VITALS — BP 121/86 | HR 78 | Ht 74.5 in | Wt 253.0 lb

## 2024-10-15 DIAGNOSIS — N138 Other obstructive and reflux uropathy: Secondary | ICD-10-CM

## 2024-10-15 DIAGNOSIS — N401 Enlarged prostate with lower urinary tract symptoms: Secondary | ICD-10-CM

## 2024-10-15 NOTE — Progress Notes (Signed)
 Catheter Removal  Patient is present today for a catheter removal.  58ml of water  was drained from the balloon. A 24FR three-way foley cath was removed from the bladder, no complications were noted. Patient tolerated well.  Performed by: Ulrich Soules, PA-C   Additional notes: Counseled patient on normal postoperative findings including dysuria, gross hematuria, and urinary urgency/leakage. Counseled patient to begin Kegel exercises 3x10 sets daily to increase urinary control and wear absorbent products as needed for security. Written and verbal resources provided today. Surgical pathology benign, results shared with patient.  Follow up: Return in about 4 months (around 02/12/2025) for Postop f/u with Dr. Francisca.

## 2024-10-15 NOTE — Patient Instructions (Signed)

## 2024-10-18 ENCOUNTER — Other Ambulatory Visit: Payer: Self-pay | Admitting: Urology

## 2024-10-21 ENCOUNTER — Telehealth: Payer: Self-pay

## 2024-10-21 NOTE — Telephone Encounter (Signed)
 Pt called with complaints of still having bleeding with urination 1 week post op HOLEP. Pt states his urine is more of a light brown color. Pt states he has some abdominal cramping here and there that eases after he urinates. I explained he might be having some bladder spasms and trying Tylenol  might help with some discomfort. Pt states no issues with urination and feels he's emptying all the way and is having some dribbling after but I explained this is normal for a little while after the HOLEP. We discussed things to look out for like increased bleeding and retention issues and pt voiced understanding.

## 2024-11-06 ENCOUNTER — Encounter: Payer: Self-pay | Admitting: Urology

## 2024-11-11 ENCOUNTER — Ambulatory Visit
Admission: EM | Admit: 2024-11-11 | Discharge: 2024-11-11 | Disposition: A | Discharge status: Home / Self Care | Attending: Emergency Medicine | Admitting: Emergency Medicine

## 2024-11-11 ENCOUNTER — Ambulatory Visit

## 2024-11-11 DIAGNOSIS — M79672 Pain in left foot: Secondary | ICD-10-CM | POA: Diagnosis not present

## 2024-11-11 DIAGNOSIS — S9032XA Contusion of left foot, initial encounter: Secondary | ICD-10-CM | POA: Diagnosis not present

## 2024-11-11 NOTE — ED Triage Notes (Signed)
 Patient presents to [facility] for left foot pain and swelling x 4 days. Concerned with gout. Treating pain with ibuprofen. Last dose last night.

## 2024-11-11 NOTE — ED Provider Notes (Addendum)
 " [facility]    CSN: [id number] Arrival date & time: 11/11/24  1458      History   Chief Complaint Chief Complaint  Patient presents with   Foot Pain    HPI Alan Arnold Sr. is a 58 y.o. male.   HPI  58 year old male with past medical history significant for bronchiectasis, HIV, OSA on CPAP, urinary retention, MDD, BPH, status post laser nucleation of the prostate presents for evaluation of pain and swelling to the top of his left foot.  No known injury.  Past Medical History:  Diagnosis Date   Anxiety    Benign prostatic hyperplasia with lower urinary tract symptoms, symptom details unspecified    Bronchiectasis with (acute) exacerbation (HCC)    HIV (human immunodeficiency virus infection) (HCC)    Major depressive disorder    OSA (obstructive sleep apnea)    Sleep disturbances    Urinary retention     There are no active problems to display for this patient.   Past Surgical History:  Procedure Laterality Date   COLONOSCOPY     HOLEP-LASER ENUCLEATION OF THE PROSTATE WITH MORCELLATION N/A 10/13/2024   Procedure: ENUCLEATION, PROSTATE, USING LASER, WITH MORCELLATION;  Surgeon: Francisca Redell BROCKS, MD;  Location: [facility];  Service: Urology;  Laterality: N/A;       Home Medications    Prior to Admission medications  Medication Sig Start Date End Date Taking? Authorizing Provider  acetaminophen  (TYLENOL ) 500 MG tablet Take 1,000 mg by mouth every 6 (six) hours as needed for mild pain (pain score 1-3).    [provider]  amLODipine (NORVASC) 10 MG tablet Take 10 mg by mouth daily. Patient not taking: Reported on 09/29/2024 07/09/24   [provider]  atorvastatin (LIPITOR) 20 MG tablet Take 20 mg by mouth daily.    [provider]  BIKTARVY 50-200-25 MG TABS tablet Take 1 tablet by mouth daily. 11/04/19   [provider]  hydrocortisone cream 1 % Apply 1 Application topically daily as needed for itching. Patient not  taking: Reported on 10/13/2024    [provider]  losartan-hydrochlorothiazide (HYZAAR) 50-12.5 MG tablet Take 1 tablet by mouth daily.    [provider]  RISPERIDONE PO Take 0.25 mg by mouth daily at 12 noon. 05/13/24   [provider]    Family History Family History  Problem Relation Age of Onset   Bladder Cancer Neg Hx    Kidney cancer Neg Hx    Prostate cancer Neg Hx     Social History Social History[1]   Allergies   Patient has no known allergies.   Review of Systems Review of Systems  Musculoskeletal:  Positive for arthralgias and joint swelling.  Skin:  Positive for color change.     Physical Exam Triage Vital Signs ED Triage Vitals  Encounter Vitals Group     BP 11/11/24 1516 (!) 137/118     Girls Systolic BP Percentile --      Girls Diastolic BP Percentile --      Boys Systolic BP Percentile --      Boys Diastolic BP Percentile --      Pulse Rate 11/11/24 1516 65     Resp 11/11/24 1516 18     Temp 11/11/24 1516 98.1 F (36.7 C)     Temp Source 11/11/24 1516 Oral     SpO2 11/11/24 1516 97 %     Weight 11/11/24 1515 246 lb 12.8 oz (111.9 kg)  Height --      Head Circumference --      Peak Flow --      Pain Score 11/11/24 1515 0     Pain Loc --      Pain Education --      Exclude from Growth Chart --    No data found.  Updated Vital Signs BP (!) 137/118 (BP Location: Left Arm)   Pulse 65   Temp 98.1 F (36.7 C) (Oral)   Resp 18   Wt 246 lb 12.8 oz (111.9 kg)   SpO2 97%   BMI 31.26 kg/m   Visual Acuity Right Eye Distance:   Left Eye Distance:   Bilateral Distance:    Right Eye Near:   Left Eye Near:    Bilateral Near:     Physical Exam Vitals and nursing note reviewed.  Constitutional:      Appearance: Normal appearance. He is not ill-appearing.  Musculoskeletal:        General: Swelling and tenderness present. No signs of injury.  Skin:    General: Skin is warm and dry.     Capillary Refill:  Capillary refill takes less than 2 seconds.     Findings: Bruising present.  Neurological:     General: No focal deficit present.     Mental Status: He is alert and oriented to person, place, and time.      UC Treatments / Results  Labs (all labs ordered are listed, but only abnormal results are displayed) Labs Reviewed - No data to display  EKG   Radiology No results found.  Procedures Procedures (including critical care time)  Medications Ordered in UC Medications - No data to display  Initial Impression / Assessment and Plan / UC Course  I have reviewed the triage vital signs and the nursing notes.  Pertinent labs & imaging results that were available during my care of the patient were reviewed by me and considered in my medical decision making (see chart for details).   Patient is a pleasant, nontoxic-appearing 58 year old male presenting for evaluation of pain and swelling to the left foot as outlined in HPI above.  As you can see in image above, there is an ecchymotic area overlying the bones of the midfoot that is quite edematous and indurated.  The swelling extends distally top of the metatarsals and these are all tender to palpation.  No appreciable crepitus.  No pain with palpation of the arch, anterior ankle, medial or lateral malleolus, calcaneus, or Achilles.  DP and PT pulses are 2+.  I will obtain a radiograph of the left foot to evaluate for any bony injury.  Left foot x-rays independently reviewed and evaluated by me.  Impression: No evidence of fracture or dislocation noted.  Soft tissue swelling is present over the lateral cuneiform.  Radiology overread is pending. Radiology impression states mild to moderate dorsal soft tissue swelling without any osseous abnormality.  The cause of the bruising and swelling to the patient's foot is unclear as he does not remember any trauma.  I am going to discharge him home with a diagnosis of left foot contusion and placed  him in a boot to protect his foot from further injury and have him follow-up with podiatry.  I will send a referral.   Final Clinical Impressions(s) / UC Diagnoses   Final diagnoses:  Left foot pain  Contusion of left foot, initial encounter     Discharge Instructions      Your  x-rays do not show any evidence of broken or dislocated bones.  The cause of the spontaneous bruise on top of your foot is unclear though I do suspect this soft tissue in nature.  Please wear the cam boot to protect your foot from further injury when you are up and moving.  When you are sitting around, in bed at night, or bathing you may remove the boot.  Keep your left foot elevated is much as possible to help decrease swelling and aid in pain relief.  You may apply ice to your foot for 20 minutes at a time, 2-3 times a day, to help with pain and inflammation.  Make sure there is a cloth between the ice and your skin so is the event skin damage.  I have made a referral to podiatry for further evaluation should your symptoms not improved.  They will contact you to make an appointment.  If you develop any increased pain or swelling I would recommend going to the ER for evaluation as you may require complex imaging with that we do not have your urgent care.     ED Prescriptions   None    PDMP not reviewed this encounter.    Bernardino Ditch, NP 11/11/24 1544     [1]  Social History Tobacco Use   Smoking status: Former    Types: Cigarettes    Start date: 2020    Quit date: 1987    Years since quitting: 39.0   Smokeless tobacco: Never  Vaping Use   Vaping status: Never Used  Substance Use Topics   Alcohol use: Not Currently    Comment: since 8 years   Drug use: Yes    Types: Cocaine, Crack cocaine    Comment: none since 2017     Bernardino Ditch, NP 11/11/24 1718  "

## 2024-11-11 NOTE — Discharge Instructions (Addendum)
 Your x-rays do not show any evidence of broken or dislocated bones.  The cause of the spontaneous bruise on top of your foot is unclear though I do suspect this soft tissue in nature.  Please wear the cam boot to protect your foot from further injury when you are up and moving.  When you are sitting around, in bed at night, or bathing you may remove the boot.  Keep your left foot elevated is much as possible to help decrease swelling and aid in pain relief.  You may apply ice to your foot for 20 minutes at a time, 2-3 times a day, to help with pain and inflammation.  Make sure there is a cloth between the ice and your skin so is the event skin damage.  I have made a referral to podiatry for further evaluation should your symptoms not improved.  They will contact you to make an appointment.  If you develop any increased pain or swelling I would recommend going to the ER for evaluation as you may require complex imaging with that we do not have your urgent care.

## 2024-11-12 ENCOUNTER — Ambulatory Visit (HOSPITAL_COMMUNITY): Payer: Self-pay

## 2024-11-12 ENCOUNTER — Emergency Department
Admission: EM | Admit: 2024-11-12 | Discharge: 2024-11-12 | Disposition: A | Discharge status: Home / Self Care | Attending: Emergency Medicine | Admitting: Emergency Medicine

## 2024-11-12 ENCOUNTER — Encounter: Payer: Self-pay | Admitting: Emergency Medicine

## 2024-11-12 ENCOUNTER — Emergency Department

## 2024-11-12 ENCOUNTER — Other Ambulatory Visit: Payer: Self-pay

## 2024-11-12 DIAGNOSIS — L03116 Cellulitis of left lower limb: Secondary | ICD-10-CM | POA: Diagnosis not present

## 2024-11-12 DIAGNOSIS — Z21 Asymptomatic human immunodeficiency virus [HIV] infection status: Secondary | ICD-10-CM | POA: Diagnosis not present

## 2024-11-12 DIAGNOSIS — L03119 Cellulitis of unspecified part of limb: Secondary | ICD-10-CM

## 2024-11-12 DIAGNOSIS — M79672 Pain in left foot: Secondary | ICD-10-CM

## 2024-11-12 DIAGNOSIS — R2242 Localized swelling, mass and lump, left lower limb: Secondary | ICD-10-CM | POA: Diagnosis present

## 2024-11-12 LAB — CBC WITH DIFFERENTIAL/PLATELET
Abs Immature Granulocytes: 0.02 K/uL (ref 0.00–0.07)
Basophils Absolute: 0 K/uL (ref 0.0–0.1)
Basophils Relative: 1 %
Eosinophils Absolute: 0.2 K/uL (ref 0.0–0.5)
Eosinophils Relative: 2 %
HCT: 37 % — ABNORMAL LOW (ref 39.0–52.0)
Hemoglobin: 11.6 g/dL — ABNORMAL LOW (ref 13.0–17.0)
Immature Granulocytes: 0 %
Lymphocytes Relative: 30 %
Lymphs Abs: 2.1 K/uL (ref 0.7–4.0)
MCH: 22.8 pg — ABNORMAL LOW (ref 26.0–34.0)
MCHC: 31.4 g/dL (ref 30.0–36.0)
MCV: 72.7 fL — ABNORMAL LOW (ref 80.0–100.0)
Monocytes Absolute: 0.7 K/uL (ref 0.1–1.0)
Monocytes Relative: 10 %
Neutro Abs: 4.1 K/uL (ref 1.7–7.7)
Neutrophils Relative %: 57 %
Platelets: 171 K/uL (ref 150–400)
RBC: 5.09 MIL/uL (ref 4.22–5.81)
RDW: 16.2 % — ABNORMAL HIGH (ref 11.5–15.5)
Smear Review: NORMAL
WBC: 7.1 K/uL (ref 4.0–10.5)
nRBC: 0 % (ref 0.0–0.2)

## 2024-11-12 LAB — COMPREHENSIVE METABOLIC PANEL WITH GFR
ALT: 8 U/L (ref 0–44)
AST: 15 U/L (ref 15–41)
Albumin: 4.2 g/dL (ref 3.5–5.0)
Alkaline Phosphatase: 46 U/L (ref 38–126)
Anion gap: 10 (ref 5–15)
BUN: 17 mg/dL (ref 6–20)
CO2: 23 mmol/L (ref 22–32)
Calcium: 9 mg/dL (ref 8.9–10.3)
Chloride: 109 mmol/L (ref 98–111)
Creatinine, Ser: 1.35 mg/dL — ABNORMAL HIGH (ref 0.61–1.24)
GFR, Estimated: >60 mL/min
Glucose, Bld: 93 mg/dL (ref 70–99)
Potassium: 3.6 mmol/L (ref 3.5–5.1)
Sodium: 142 mmol/L (ref 135–145)
Total Bilirubin: 0.6 mg/dL (ref 0.0–1.2)
Total Protein: 7.1 g/dL (ref 6.5–8.1)

## 2024-11-12 LAB — URIC ACID: Uric Acid, Serum: 3.8 mg/dL (ref 3.7–8.6)

## 2024-11-12 MED ORDER — CLINDAMYCIN HCL 300 MG PO CAPS: 300.0000 mg | ORAL_CAPSULE | Freq: Three times a day (TID) | ORAL | 0 refills | Status: AC

## 2024-11-12 MED ORDER — MORPHINE SULFATE (PF) 4 MG/ML IV SOLN: 4.0000 mg | Freq: Once | INTRAVENOUS | Status: DC

## 2024-11-12 MED ORDER — ONDANSETRON HCL 4 MG/2ML IJ SOLN: 4.0000 mg | Freq: Once | INTRAMUSCULAR | Status: DC

## 2024-11-12 MED ORDER — PREDNISONE 10 MG (21) PO TBPK: ORAL_TABLET | ORAL | 0 refills | Status: AC

## 2024-11-12 MED ORDER — SODIUM CHLORIDE 0.9 % IV SOLN
1.0000 g | Freq: Once | INTRAVENOUS | Status: AC
  Administered 2024-11-12: 1 g via INTRAVENOUS
  Filled 2024-11-12: qty 10

## 2024-11-12 NOTE — ED Triage Notes (Signed)
 Pt in via POV, reports left foot swelling and pain x 4 days.  Denies any recent injury.  Some redness noted to top of foot.  Ambulatory to triage, NAD noted at this time.

## 2024-11-12 NOTE — Discharge Instructions (Signed)
 One of your blood test results will not be available until later this evening.  You may check your MyChart to review the results of the uric acid.  You have been prescribed medications that will cover both inflammation and infection.  If these medications are not providing you any symptom improvement, please either see your primary care provider or the podiatrist.  If symptoms change or worsen and you are unable to schedule an appointment please return to the emergency department.

## 2024-11-12 NOTE — ED Provider Notes (Signed)
----------------------------------------- °  3:47 PM on 11/12/2024 -----------------------------------------  Blood pressure (!) 150/83, pulse 82, temperature 98.3 F (36.8 C), temperature source Oral, resp. rate 15, height 6' 2 (1.88 m), weight 113.4 kg, SpO2 97%.  Assuming care from Devere Perry, PA-C.  In short, Alan Arnold. is a 58 y.o. male with a chief complaint of foot pain, swelling, and redness after wearing a pair of Crocks. He was placed in a CAM boot yesterday at urgent care and symptoms have worsened.  Refer to the original H&P for additional details.  The current plan of care is to await results of uric acid.   Clinical Course as of 11/12/24 1654  Wed Nov 12, 2024  1648 According to lab staff, uric acid is now a send out test and will not result until later this evening.   Plan will be to discharge him home with prescriptions for Clindamycin  to cover any infectious component and prednisone  taper that will cover inflammation if related to gout. Patient states that several years ago he had pain and swelling in the same foot he believes was related to gout. He will be advised to follow up with primary care/podiatry if not improving over the next few days. He is to return to the ER for symptoms that change, worsen, or for new concerns. [CT]    Clinical Course User Index [CT] Blane Worthington B, FNP     Medications  morphine  (PF) 4 MG/ML injection 4 mg (0 mg Intravenous Hold 11/12/24 1426)  ondansetron  (ZOFRAN ) injection 4 mg (0 mg Intravenous Hold 11/12/24 1426)  cefTRIAXone  (ROCEPHIN ) 1 g in sodium chloride  0.9 % 100 mL IVPB (1 g Intravenous New Bag/Given 11/12/24 1600)     ED Discharge Orders          Ordered    clindamycin  (CLEOCIN ) 300 MG capsule  3 times daily        11/12/24 1652    predniSONE  (STERAPRED UNI-PAK 21 TAB) 10 MG (21) TBPK tablet        11/12/24 1652           Final diagnoses:  Cellulitis of foot  Acute pain of left foot      Herlinda Kirk NOVAK, FNP 11/12/24 1654    Alan Rossie HERO, MD 11/12/24 2248

## 2024-11-12 NOTE — ED Provider Notes (Signed)
 "  [facility] Provider Note    Event Date/Time   First MD Initiated Contact with Patient 11/12/24 1339     (approximate)   History   Leg Swelling   HPI  Alan GEIER Sr. is a 58 y.o. male history of HIV, urinary retention, anxiety presents emergency department for left foot redness and swelling.  Patient was seen in urgent care yesterday placed in a boot.  Was not placed on any medication.  Was told they were unsure exactly what was causing the foot pain.  Patient states it is red and swollen.  Became more uncomfortable overnight.  States the boot is helping a little but he is unsure of why the foot is red and swollen.  Denies fever or chills at this time.  No history of diabetes.      Physical Exam   Triage Vital Signs: ED Triage Vitals  Encounter Vitals Group     BP 11/12/24 1325 (!) 150/83     Girls Systolic BP Percentile --      Girls Diastolic BP Percentile --      Boys Systolic BP Percentile --      Boys Diastolic BP Percentile --      Pulse Rate 11/12/24 1325 82     Resp 11/12/24 1325 15     Temp 11/12/24 1325 98.3 F (36.8 C)     Temp Source 11/12/24 1325 Oral     SpO2 11/12/24 1325 97 %     Weight 11/12/24 1326 250 lb (113.4 kg)     Height 11/12/24 1326 6' 2 (1.88 m)     Head Circumference --      Peak Flow --      Pain Score 11/12/24 1325 10     Pain Loc --      Pain Education --      Exclude from Growth Chart --     Most recent vital signs: Vitals:   11/12/24 1325  BP: (!) 150/83  Pulse: 82  Resp: 15  Temp: 98.3 F (36.8 C)  SpO2: 97%     General: Awake, no distress.   CV:  Good peripheral perfusion. Resp:  Normal effort.  Abd:  No distention.   Other:  Left foot with redness swelling, large round bruise type area on the dorsum of the midfoot.  Neurovascular intact, area is not fluctuant   ED Results / Procedures / Treatments   Labs (all labs ordered are listed, but only abnormal results are displayed) Labs  Reviewed  COMPREHENSIVE METABOLIC PANEL WITH GFR - Abnormal; Notable for the following components:      Result Value   Creatinine, Ser 1.35 (*)    All other components within normal limits  CBC WITH DIFFERENTIAL/PLATELET - Abnormal; Notable for the following components:   Hemoglobin 11.6 (*)    HCT 37.0 (*)    MCV 72.7 (*)    MCH 22.8 (*)    RDW 16.2 (*)    All other components within normal limits  CBC WITH DIFFERENTIAL/PLATELET  URIC ACID     EKG     RADIOLOGY X-ray left foot    PROCEDURES:   Procedures  Critical Care:  no Chief Complaint  Patient presents with   Leg Swelling      MEDICATIONS ORDERED IN ED: Medications  morphine  (PF) 4 MG/ML injection 4 mg (0 mg Intravenous Hold 11/12/24 1426)  ondansetron  (ZOFRAN ) injection 4 mg (0 mg Intravenous Hold 11/12/24 1426)  IMPRESSION / MDM / ASSESSMENT AND PLAN / ED COURSE  I reviewed the triage vital signs and the nursing notes.                              Differential diagnosis includes, but is not limited to, cellulitis, gout, occult fracture, DVT  Patient's presentation is most consistent with acute illness / injury with system symptoms.  Labs and imaging ordered, did offer patient pain medication.  He is deferring at this time.  Will let nursing staff know if he changes his mind   X-ray left foot, independent review interpretation by me as being negative for acute abnormality, labs reassuring at this time, uric acid pending  Care transferred to [facility], FNP.  Plan is to assess for uric acid, IV antibiotics and p.o. antibiotics at discharge.   FINAL CLINICAL IMPRESSION(S) / ED DIAGNOSES   Final diagnoses:  Cellulitis of foot     Rx / DC Orders   ED Discharge Orders     None        Note:  This document was prepared using Dragon voice recognition software and may include unintentional dictation errors.    Gasper Devere ORN, PA-C 11/12/24 1536    Viviann Pastor,  MD 11/15/24 [phone]  "

## 2024-12-09 ENCOUNTER — Ambulatory Visit

## 2024-12-09 ENCOUNTER — Ambulatory Visit: Admitting: Podiatry

## 2024-12-09 DIAGNOSIS — M216X2 Other acquired deformities of left foot: Secondary | ICD-10-CM | POA: Diagnosis not present

## 2024-12-09 DIAGNOSIS — M216X1 Other acquired deformities of right foot: Secondary | ICD-10-CM | POA: Diagnosis not present

## 2024-12-09 DIAGNOSIS — M10072 Idiopathic gout, left ankle and foot: Secondary | ICD-10-CM | POA: Diagnosis not present

## 2024-12-09 DIAGNOSIS — G4733 Obstructive sleep apnea (adult) (pediatric): Secondary | ICD-10-CM

## 2024-12-09 DIAGNOSIS — M778 Other enthesopathies, not elsewhere classified: Secondary | ICD-10-CM | POA: Diagnosis not present

## 2024-12-09 NOTE — Progress Notes (Signed)
 "  Subjective:  Patient ID: Alan FRANCISCO Sr., male    DOB: 09-16-1966,  MRN: [id number]  Chief Complaint  Patient presents with   Foot Pain    Left foot pt stated that his foot swelled up one day he stated that it was red and swollen and he could not put pressure on it     59 y.o. male presents with the above complaint.  Patient presents with left midfoot red hot swollen joint with underlying arthritis.  Patient states doing much better.  Cannot put any pressure on he has a high red meat intake.  Wanted to get it evaluated does not wear any orthotics or regular shoes that are nonsupportive.  Pain scale is 5 out of 10 dull aching nature hurts with ambulation worse with pressure   Review of Systems: Negative except as noted in the HPI. Denies N/V/F/Ch.  Past Medical History:  Diagnosis Date   Anxiety    Benign prostatic hyperplasia with lower urinary tract symptoms, symptom details unspecified    Bronchiectasis with (acute) exacerbation (HCC)    HIV (human immunodeficiency virus infection) (HCC)    Major depressive disorder    OSA (obstructive sleep apnea)    Sleep disturbances    Urinary retention    Current Medications[1]  Tobacco Use History[2]  Allergies[3] Objective:  There were no vitals filed for this visit. There is no height or weight on file to calculate BMI. Constitutional Well developed. Well nourished.  Vascular Dorsalis pedis pulses palpable bilaterally. Posterior tibial pulses palpable bilaterally. Capillary refill normal to all digits.  No cyanosis or clubbing noted. Pedal hair growth normal.  Neurologic Normal speech. Oriented to person, place, and time. Epicritic sensation to light touch grossly present bilaterally.  Dermatologic Nails well groomed and normal in appearance. No open wounds. No skin lesions.  Orthopedic: Pain on palpation left dorsal midfoot.  Clinically able to appreciate the underlying arthritic changes.  No further redness noted.  Some  swelling noted.  Pain on palpation to the midfoot joints.  Pes cavus foot type noted   Radiographs: None Assessment:   1. Other acquired deformities of right foot   2. Other acquired deformities of left foot   3. Acute idiopathic gout involving toe of left foot   4. Capsulitis of left foot    Plan:  Patient was evaluated and treated and all questions answered.  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  Patient agrees with plan like to proceed with steroid injection -A steroid injection was performed at left dorsal midfoot using 1% plain Lidocaine  and 10 mg of Kenalog. This was well tolerated.  Pes cavus/foot deformity -I explained to patient the etiology of pes cavus and relationship with heel pain/arch pain and various treatment options were discussed.  Given patient foot structure in the setting of heel pain/arch pain I believe patient will benefit from custom-made orthotics to help control the hindfoot motion support the arch of the foot and take the stress away from arches.  Patient agrees with the plan like to proceed with orthotics -Patient was casted for orthotics    No follow-ups on file.     [1]  Current Outpatient Medications:    acetaminophen  (TYLENOL ) 500 MG tablet, Take 1,000 mg by mouth every  6 (six) hours as needed for mild pain (pain score 1-3)., Disp: , Rfl:    amLODipine (NORVASC) 10 MG tablet, Take 10 mg by mouth daily. (Patient not taking: Reported on 09/29/2024), Disp: , Rfl:    atorvastatin (LIPITOR) 20 MG tablet, Take 20 mg by mouth daily., Disp: , Rfl:    BIKTARVY 50-200-25 MG TABS tablet, Take 1 tablet by mouth daily., Disp: , Rfl:    hydrocortisone cream 1 %, Apply 1 Application topically daily as needed for itching.  (Patient not taking: Reported on 10/13/2024), Disp: , Rfl:    losartan-hydrochlorothiazide (HYZAAR) 50-12.5 MG tablet, Take 1 tablet by mouth daily., Disp: , Rfl:    predniSONE  (STERAPRED UNI-PAK 21 TAB) 10 MG (21) TBPK tablet, Take 6 tablets on the first day and decrease by 1 tablet each day until finished., Disp: 21 tablet, Rfl: 0   RISPERIDONE PO, Take 0.25 mg by mouth daily at 12 noon., Disp: , Rfl:  [2]  Social History Tobacco Use  Smoking Status Former   Types: Cigarettes   Start date: 2020   Quit date: 1987   Years since quitting: 39.0  Smokeless Tobacco Never  [3] No Known Allergies  "

## 2025-01-29 ENCOUNTER — Ambulatory Visit: Admitting: Urology
